# Patient Record
Sex: Female | Born: 1985 | Race: White | Hispanic: No | Marital: Married | Smoking: Former smoker
Health system: Southern US, Community
[De-identification: ages and names within clinical notes are randomized; demographics above are authoritative.]

## PROBLEM LIST (undated history)

## (undated) DIAGNOSIS — E079 Disorder of thyroid, unspecified: Secondary | ICD-10-CM

## (undated) DIAGNOSIS — K219 Gastro-esophageal reflux disease without esophagitis: Secondary | ICD-10-CM

## (undated) DIAGNOSIS — F419 Anxiety disorder, unspecified: Secondary | ICD-10-CM

## (undated) DIAGNOSIS — G43909 Migraine, unspecified, not intractable, without status migrainosus: Secondary | ICD-10-CM

## (undated) DIAGNOSIS — N809 Endometriosis, unspecified: Secondary | ICD-10-CM

## (undated) DIAGNOSIS — F32A Depression, unspecified: Secondary | ICD-10-CM

## (undated) DIAGNOSIS — I499 Cardiac arrhythmia, unspecified: Secondary | ICD-10-CM

## (undated) HISTORY — PX: ABLATION: SHX5711

## (undated) HISTORY — DX: Depression, unspecified: F32.A

## (undated) HISTORY — DX: Migraine, unspecified, not intractable, without status migrainosus: G43.909

## (undated) HISTORY — DX: Gastro-esophageal reflux disease without esophagitis: K21.9

## (undated) HISTORY — DX: Disorder of thyroid, unspecified: E07.9

## (undated) HISTORY — DX: Endometriosis, unspecified: N80.9

## (undated) HISTORY — DX: Cardiac arrhythmia, unspecified: I49.9

## (undated) HISTORY — DX: Anxiety disorder, unspecified: F41.9

## (undated) HISTORY — PX: HYSTEROSCOPY: SHX211

---
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
## 2016-08-15 DIAGNOSIS — N809 Endometriosis, unspecified: Secondary | ICD-10-CM | POA: Insufficient documentation

## 2020-07-15 DIAGNOSIS — X500XXA Overexertion from strenuous movement or load, initial encounter: Secondary | ICD-10-CM | POA: Diagnosis not present

## 2020-07-15 DIAGNOSIS — S99911A Unspecified injury of right ankle, initial encounter: Secondary | ICD-10-CM | POA: Diagnosis not present

## 2020-07-15 DIAGNOSIS — S93401A Sprain of unspecified ligament of right ankle, initial encounter: Secondary | ICD-10-CM | POA: Diagnosis not present

## 2020-07-20 ENCOUNTER — Other Ambulatory Visit: Payer: Self-pay

## 2020-07-20 ENCOUNTER — Ambulatory Visit: Payer: BC Managed Care – PPO | Admitting: Nurse Practitioner

## 2020-07-20 ENCOUNTER — Encounter: Payer: Self-pay | Admitting: Nurse Practitioner

## 2020-07-20 VITALS — BP 104/73 | HR 95 | Wt 217.0 lb

## 2020-07-20 DIAGNOSIS — F419 Anxiety disorder, unspecified: Secondary | ICD-10-CM | POA: Diagnosis not present

## 2020-07-20 DIAGNOSIS — F339 Major depressive disorder, recurrent, unspecified: Secondary | ICD-10-CM

## 2020-07-20 MED ORDER — SERTRALINE HCL 100 MG PO TABS: 2.0000 | ORAL_TABLET | Freq: Every day | ORAL | 2 refills | Status: DC

## 2020-07-20 MED ORDER — LAMOTRIGINE 25 MG PO TABS: 75.0000 mg | ORAL_TABLET | Freq: Every day | ORAL | 2 refills | Status: DC

## 2020-07-20 MED ORDER — LORATADINE 10 MG PO TABS: 10.0000 mg | ORAL_TABLET | Freq: Every day | ORAL | 2 refills | Status: DC

## 2020-07-20 MED ORDER — BUPROPION HCL ER (SR) 200 MG PO TB12
200.0000 mg | ORAL_TABLET | Freq: Two times a day (BID) | ORAL | 2 refills | Status: DC
Start: 2020-07-20 — End: 2021-12-05

## 2020-07-20 NOTE — Assessment & Plan Note (Signed)
Patient is new to clinic for establishing care.  GAD-7 completed.  Education provided to patient on generalized anxiety management.  Symptoms are well managed on current medication dose.  Rx refill sent to pharmacy.

## 2020-07-20 NOTE — Patient Instructions (Signed)
http://APA.org/depression-guideline"> https://clinicalkey.com"> http://point-of-care.elsevierperformancemanager.com/skills/"> http://point-of-care.elsevierperformancemanager.com">  Managing Depression, Adult Depression is a mental health condition that affects your thoughts, feelings, and actions. Being diagnosed with depression can bring you relief if you did not know why you have felt or behaved a certain way. It could also leave you feeling overwhelmed with uncertainty about your future. Preparing yourself tomanage your symptoms can help you feel more positive about your future. How to manage lifestyle changes Managing stress  Stress is your body's reaction to life changes and events, both good and bad. Stress can add to your feelings of depression. Learning to manage your stresscan help lessen your feelings of depression. Try some of the following approaches to reducing your stress (stress reduction techniques): Listen to music that you enjoy and that inspires you. Try using a meditation app or take a meditation class. Develop a practice that helps you connect with your spiritual self. Walk in nature, pray, or go to a place of worship. Do some deep breathing. To do this, inhale slowly through your nose. Pause at the top of your inhale for a few seconds and then exhale slowly, letting your muscles relax. Practice yoga to help relax and work your muscles. Choose a stress reduction technique that suits your lifestyle and personality. These techniques take time and practice to develop. Set aside 5-15 minutes a day to do them. Therapists can offer training in these techniques. Other things you can do to manage stress include: Keeping a stress diary. Knowing your limits and saying no when you think something is too much. Paying attention to how you react to certain situations. You may not be able to control everything, but you can change your reaction. Adding humor to your life by watching funny films  or TV shows. Making time for activities that you enjoy and that relax you.  Medicines Medicines, such as antidepressants, are often a part of treatment for depression. Talk with your pharmacist or health care provider about all the medicines, supplements, and herbal products that you take, their possible side effects, and what medicines and other products are safe to take together. Make sure to report any side effects you may have to your health care provider. Relationships Your health care provider may suggest family therapy, couples therapy, orindividual therapy as part of your treatment. How to recognize changes Everyone responds differently to treatment for depression. As you recover from depression, you may start to: Have more interest in doing activities. Feel less hopeless. Have more energy. Overeat less often, or have a better appetite. Have better mental focus. It is important to recognize if your depression is not getting better or is getting worse. The symptoms you had in the beginning may return, such as: Tiredness (fatigue) or low energy. Eating too much or too little. Sleeping too much or too little. Feeling restless, agitated, or hopeless. Trouble focusing or making decisions. Unexplained physical complaints. Feeling irritable, angry, or aggressive. If you or your family members notice these symptoms coming back, let yourhealth care provider know right away. Follow these instructions at home: Activity  Try to get some form of exercise each day, such as walking, biking, swimming, or lifting weights. Practice stress reduction techniques. Engage your mind by taking a class or doing some volunteer work.  Lifestyle Get the right amount and quality of sleep. Cut down on using caffeine, tobacco, alcohol, and other potentially harmful substances. Eat a healthy diet that includes plenty of vegetables, fruits, whole grains, low-fat dairy products, and lean protein. Do not   eat  a lot of foods that are high in solid fats, added sugars, or salt (sodium). General instructions Take over-the-counter and prescription medicines only as told by your health care provider. Keep all follow-up visits as told by your health care provider. This is important. Where to find support Talking to others  Friends and family members can be sources of support and guidance. Talk to trusted friends or family members about your condition. Explain your symptoms to them, and let them know that you are working with a health care provider to treat your depression. Tell friends and family members how they also can behelpful. Finances Find appropriate mental health providers that fit with your financial situation. Talk with your health care provider about options to get reduced prices on your medicines. Where to find more information You can find support in your area from: Anxiety and Depression Association of America (ADAA): www.adaa.org Mental Health America: www.mentalhealthamerica.net [employer] on Mental Illness: www.nami.org Contact a health care provider if: You stop taking your antidepressant medicines, and you have any of these symptoms: Nausea. Headache. Light-headedness. Chills and body aches. Not being able to sleep (insomnia). You or your friends and family think your depression is getting worse. Get help right away if: You have thoughts of hurting yourself or others. If you ever feel like you may hurt yourself or others, or have thoughts about taking your own life, get help right away. Go to your nearest emergency department or: Call your local emergency services (911 in the [place] Call a suicide crisis helpline, such as the [place] at 2131767385. This is open 24 hours a day in the [place] Text the Crisis Text Line at 414-639-1540 (in the [place].). Summary If you are diagnosed with depression, preparing yourself to manage your symptoms is a good way  to feel positive about your future. Work with your health care provider on a management plan that includes stress reduction techniques, medicines (if applicable), therapy, and healthy lifestyle habits. Keep talking with your health care provider about how your treatment is working. If you have thoughts about taking your own life, call a suicide crisis helpline or text a crisis text line. This information is not intended to replace advice given to you by your health care provider. Make sure you discuss any questions you have with your healthcare provider. Document Revised: 12/04/2018 Document Reviewed: 12/04/2018 Elsevier Patient Education  2022 [employer]. http://NIMH.NIH.Gov">  Generalized Anxiety Disorder, Adult Generalized anxiety disorder (GAD) is a mental health condition. Unlike normal worries, anxiety related to GAD is not triggered by a specific event. These worries do not fade or get better with time. GAD interferes with relationships,work, and school. GAD symptoms can vary from mild to severe. People with severe GAD can have intense waves of anxiety with physical symptoms that are similar to panicattacks. What are the causes? The exact cause of GAD is not known, but the following are believed to have an impact: Differences in natural brain chemicals. Genes passed down from parents to children. Differences in the way threats are perceived. Development during childhood. Personality. What increases the risk? The following factors may make you more likely to develop this condition: Being female. Having a family history of anxiety disorders. Being very shy. Experiencing very stressful life events, such as the death of a loved one. Having a very stressful family environment. What are the signs or symptoms? People with GAD often worry excessively about many things in their lives, such as their health  and family. Symptoms may also include: Mental and emotional symptoms: Worrying  excessively about natural disasters. Fear of being late. Difficulty concentrating. Fears that others are judging your performance. Physical symptoms: Fatigue. Headaches, muscle tension, muscle twitches, trembling, or feeling shaky. Feeling like your heart is pounding or beating very fast. Feeling out of breath or like you cannot take a deep breath. Having trouble falling asleep or staying asleep, or experiencing restlessness. Sweating. Nausea, diarrhea, or irritable bowel syndrome (IBS). Behavioral symptoms: Experiencing erratic moods or irritability. Avoidance of new situations. Avoidance of people. Extreme difficulty making decisions. How is this diagnosed? This condition is diagnosed based on your symptoms and medical history. You will also have a physical exam. Your health care provider may perform tests torule out other possible causes of your symptoms. To be diagnosed with GAD, a person must have anxiety that: Is out of his or her control. Affects several different aspects of his or her life, such as work and relationships. Causes distress that makes him or her unable to take part in normal activities. Includes at least three symptoms of GAD, such as restlessness, fatigue, trouble concentrating, irritability, muscle tension, or sleep problems. Before your health care provider can confirm a diagnosis of GAD, these symptoms must be present more days than they are not, and they must last for 6 months orlonger. How is this treated? This condition may be treated with: Medicine. Antidepressant medicine is usually prescribed for long-term daily control. Anti-anxiety medicines may be added in severe cases, especially when panic attacks occur. Talk therapy (psychotherapy). Certain types of talk therapy can be helpful in treating GAD by providing support, education, and guidance. Options include: Cognitive behavioral therapy (CBT). People learn coping skills and self-calming techniques to  ease their physical symptoms. They learn to identify unrealistic thoughts and behaviors and to replace them with more appropriate thoughts and behaviors. Acceptance and commitment therapy (ACT). This treatment teaches people how to be mindful as a way to cope with unwanted thoughts and feelings. Biofeedback. This process trains you to manage your body's response (physiological response) through breathing techniques and relaxation methods. You will work with a therapist while machines are used to monitor your physical symptoms. Stress management techniques. These include yoga, meditation, and exercise. A mental health specialist can help determine which treatment is best for you. Some people see improvement with one type of therapy. However, other peoplerequire a combination of therapies. Follow these instructions at home: Lifestyle Maintain a consistent routine and schedule. Anticipate stressful situations. Create a plan, and allow extra time to work with your plan. Practice stress management or self-calming techniques that you have learned from your therapist or your health care provider. General instructions Take over-the-counter and prescription medicines only as told by your health care provider. Understand that you are likely to have setbacks. Accept this and be kind to yourself as you persist to take better care of yourself. Recognize and accept your accomplishments, even if you judge them as small. Keep all follow-up visits as told by your health care provider. This is important. Contact a health care provider if: Your symptoms do not get better. Your symptoms get worse. You have signs of depression, such as: A persistently sad or irritable mood. Loss of enjoyment in activities that used to bring you joy. Change in weight or eating. Changes in sleeping habits. Avoiding friends or family members. Loss of energy for normal tasks. Feelings of guilt or worthlessness. Get help right away  if: You have serious thoughts  about hurting yourself or others. If you ever feel like you may hurt yourself or others, or have thoughts about taking your own life, get help right away. Go to your nearest emergency department or: Call your local emergency services (911 in the [place] Call a suicide crisis helpline, such as the [place] at (417)603-2728. This is open 24 hours a day in the [place] Text the Crisis Text Line at 417-110-2750 (in the [place].). Summary Generalized anxiety disorder (GAD) is a mental health condition that involves worry that is not triggered by a specific event. People with GAD often worry excessively about many things in their lives, such as their health and family. GAD may cause symptoms such as restlessness, trouble concentrating, sleep problems, frequent sweating, nausea, diarrhea, headaches, and trembling or muscle twitching. A mental health specialist can help determine which treatment is best for you. Some people see improvement with one type of therapy. However, other people require a combination of therapies. This information is not intended to replace advice given to you by your health care provider. Make sure you discuss any questions you have with your healthcare provider. Document Revised: 11/13/2018 Document Reviewed: 11/13/2018 Elsevier Patient Education  [place].

## 2020-07-20 NOTE — Assessment & Plan Note (Signed)
Patient is new to clinic for establishing care.  PHQ-9 completed.  Education provided to patient on generalized anxiety management.  Symptoms are well managed on current medication dose.  Rx refill sent to pharmacy.

## 2020-07-20 NOTE — Progress Notes (Signed)
New Patient Note  RE: Valerie Kim MRN: [id number] DOB: 1985-06-18 Date of Office Visit: 07/20/2020  Chief Complaint: Medical Management of Chronic Issues (New Patient to establish care), Anxiety, and Depression  History of Present Illness:  Depression, Follow-up  She  was last seen for this 2 years ago. Changes made at last visit include Lamictal 75 mg tablet daily, sertraline 200 mg tablet daily and Wellbutrin 200 mg tablet daily.   She reports good compliance with treatment. She is not having side effects.   She reports good tolerance of treatment. Current symptoms include: depressed mood, difficulty concentrating, and fatigue She feels she is Improved since last visit.  Depression screen [facility] 2/9 07/20/2020 07/20/2020  Decreased Interest 2 0  Down, Depressed, Hopeless 2 0  PHQ - 2 Score 4 0  Altered sleeping 2 -  Tired, decreased energy 3 -  Change in appetite 2 -  Feeling bad or failure about yourself  2 -  Trouble concentrating 2 -  Moving slowly or fidgety/restless 0 -  Suicidal thoughts 0 -  PHQ-9 Score 15 -    [place] Office Visit from 07/20/2020 in [facility]  PHQ-9 Total Score 15        Anxiety: Patient complains of anxiety disorder.  She has the following symptoms: difficulty concentrating, fatigue, irritable. Onset of symptoms was approximately 2 years ago, gradually improving since that time. She denies current suicidal and homicidal ideation. Family history significant for depression.Possible organic causes contributing are: none. Risk factors: positive family history in  father and previous episode of depression Previous treatment includes Wellbutrin and none.  She complains of the following side effects from the treatment: none.     Assessment and Plan: Thi is a 35 y.o. female with: No problem-specific Assessment & Plan notes found for this encounter.  Return in about 6 months (around  01/19/2021).   Diagnostics:   Past Medical History: Patient Active Problem List   Diagnosis Date Noted   Anxiety 07/20/2020   Depression, recurrent ([place]) 07/20/2020   Past Medical History:  Diagnosis Date   Depression    Endometriosis    GERD (gastroesophageal reflux disease)    Migraines    Past Surgical History: Past Surgical History:  Procedure Laterality Date   ABLATION     cardiac   HYSTEROSCOPY     Medication List:  Current Outpatient Medications  Medication Sig Dispense Refill   lamoTRIgine (LAMICTAL) 25 MG tablet Take 3 tablets (75 mg total) by mouth daily. 180 tablet 2   nitrofurantoin (MACRODANTIN) 50 MG capsule Take 50 mg by mouth daily as needed.     norethindrone (AYGESTIN) 5 MG tablet Take 5 mg by mouth daily.     buPROPion (WELLBUTRIN SR) 200 MG 12 hr tablet Take 1 tablet (200 mg total) by mouth in the morning and at bedtime. 90 tablet 2   loratadine (CLARITIN) 10 MG tablet Take 1 tablet (10 mg total) by mouth daily. 30 tablet 2   sertraline (ZOLOFT) 100 MG tablet Take 2 tablets (200 mg total) by mouth daily. 90 tablet 2   No current facility-administered medications for this visit.   Allergies: No Known Allergies Social History: Social History   Socioeconomic History   Marital status: Not on file    Spouse name: Not on file   Number of children: Not on file   Years of education: Not on file   Highest education level: Not on file  Occupational History   Not on  file  Tobacco Use   Smoking status: Former    Packs/day: 2.00    Years: 15.00    Pack years: 30.00    Types: Cigarettes, E-cigarettes    Quit date: 2017    Years since quitting: 5.4   Smokeless tobacco: Never  Vaping Use   Vaping Use: Some days  Substance and Sexual Activity   Alcohol use: Never   Drug use: Yes    Types: Marijuana   Sexual activity: Yes    Partners: Female, Female    Birth control/protection: OCP  Other Topics Concern   Not on file  Social History Narrative    Not on file   Social Determinants of Health   Financial Resource Strain: Not on file  Food Insecurity: Not on file  Transportation Needs: Not on file  Physical Activity: Not on file  Stress: Not on file  Social Connections: Not on file       Family History: Family History  Problem Relation Age of Onset   Hyperlipidemia Mother    Thyroid disease Mother    Depression Mother    Bipolar disorder Mother    Hyperlipidemia Father    Alcohol abuse Father    Depression Father    Bipolar disorder Father    Hypertension Paternal Grandmother    Heart disease Paternal Grandfather          Review of Systems  Constitutional: Negative.   HENT: Negative.    Eyes: Negative.   Respiratory: Negative.    Cardiovascular: Negative.   Skin:  Negative for rash.  Psychiatric/Behavioral:  The patient is nervous/anxious.   All other systems reviewed and are negative. Objective: BP 104/73   Pulse 95   Wt 217 lb (98.4 kg)   LMP 09/06/2017 (Approximate)   SpO2 98%  There is no height or weight on file to calculate BMI. Physical Exam Vitals reviewed.  Constitutional:      Appearance: Normal appearance.  HENT:     Head: Normocephalic.     Nose: Nose normal.     Mouth/Throat:     Mouth: Mucous membranes are moist.  Eyes:     Conjunctiva/sclera: Conjunctivae normal.  Cardiovascular:     Rate and Rhythm: Normal rate and regular rhythm.     Pulses: Normal pulses.     Heart sounds: Normal heart sounds.  Pulmonary:     Effort: Pulmonary effort is normal.     Breath sounds: Normal breath sounds.  Abdominal:     General: Bowel sounds are normal.  Skin:    Findings: No rash.  Neurological:     Mental Status: She is alert and oriented to person, place, and time.  Psychiatric:        Mood and Affect: Mood is anxious and depressed.        Speech: Speech normal.        Behavior: Behavior normal.        Thought Content: Thought content normal.   The plan was reviewed with the  patient/family, and all questions/concerned were addressed.  It was my pleasure to see Valerie Kim today and participate in her care. Please feel free to contact me with any questions or concerns.  Sincerely,  Jac Canavan NP [place]

## 2020-08-01 ENCOUNTER — Encounter (HOSPITAL_BASED_OUTPATIENT_CLINIC_OR_DEPARTMENT_OTHER): Payer: Self-pay

## 2020-08-01 ENCOUNTER — Emergency Department (HOSPITAL_BASED_OUTPATIENT_CLINIC_OR_DEPARTMENT_OTHER)
Admission: EM | Admit: 2020-08-01 | Discharge: 2020-08-02 | Disposition: A | Discharge status: Home / Self Care | Payer: BC Managed Care – PPO | Attending: Emergency Medicine | Admitting: Emergency Medicine

## 2020-08-01 ENCOUNTER — Other Ambulatory Visit: Payer: Self-pay

## 2020-08-01 ENCOUNTER — Emergency Department (HOSPITAL_BASED_OUTPATIENT_CLINIC_OR_DEPARTMENT_OTHER): Payer: BC Managed Care – PPO

## 2020-08-01 DIAGNOSIS — G4453 Primary thunderclap headache: Secondary | ICD-10-CM | POA: Diagnosis not present

## 2020-08-01 DIAGNOSIS — G43909 Migraine, unspecified, not intractable, without status migrainosus: Secondary | ICD-10-CM | POA: Diagnosis not present

## 2020-08-01 DIAGNOSIS — R519 Headache, unspecified: Secondary | ICD-10-CM | POA: Diagnosis not present

## 2020-08-01 DIAGNOSIS — Z87891 Personal history of nicotine dependence: Secondary | ICD-10-CM | POA: Insufficient documentation

## 2020-08-01 DIAGNOSIS — Z79899 Other long term (current) drug therapy: Secondary | ICD-10-CM | POA: Diagnosis not present

## 2020-08-01 MED ORDER — PROCHLORPERAZINE EDISYLATE 10 MG/2ML IJ SOLN
10.0000 mg | Freq: Once | INTRAMUSCULAR | Status: AC
  Administered 2020-08-01: 10 mg via INTRAVENOUS
  Filled 2020-08-01: qty 2

## 2020-08-01 MED ORDER — DIPHENHYDRAMINE HCL 50 MG/ML IJ SOLN
25.0000 mg | Freq: Once | INTRAMUSCULAR | Status: AC
  Administered 2020-08-01: 25 mg via INTRAVENOUS
  Filled 2020-08-01: qty 1

## 2020-08-01 MED ORDER — KETOROLAC TROMETHAMINE 15 MG/ML IJ SOLN
15.0000 mg | Freq: Once | INTRAMUSCULAR | Status: AC
  Administered 2020-08-01: 15 mg via INTRAVENOUS
  Filled 2020-08-01: qty 1

## 2020-08-01 NOTE — ED Triage Notes (Addendum)
Hx of migraines but this started approx 3 hr ago and reports the worse headache she has ever had. +nausea  took motrin 400mg  1 hr pta.

## 2020-08-01 NOTE — ED Provider Notes (Signed)
[facility] DE[facility] Provider Note   CSN: [id number] Arrival date & time: 08/01/20  2112     History Chief Complaint  Patient presents with   Migraine    Valerie Kim is a 35 y.o. female.  HPI     This is a 35 year old female with history of migraines and depression who presents with headache.  Patient reports that she had sudden onset of worst headache of her life after orgasm around 6 PM.  She has a history of migraines and has had postcoital headaches in the past but this was more severe.  She states it is mostly right-sided.  She has had nausea without vomiting.  No vision changes, weakness, numbness, strokelike symptoms.  Currently she rates her pain at 7 out of 10.  She denies any fevers or recent illnesses.  Past Medical History:  Diagnosis Date   Depression    Endometriosis    GERD (gastroesophageal reflux disease)    Migraines     Patient Active Problem List   Diagnosis Date Noted   Anxiety 07/20/2020   Depression, recurrent ([place]) 07/20/2020    Past Surgical History:  Procedure Laterality Date   ABLATION     cardiac   HYSTEROSCOPY       OB History   No obstetric history on file.     Family History  Problem Relation Age of Onset   Hyperlipidemia Mother    Thyroid disease Mother    Depression Mother    Bipolar disorder Mother    Hyperlipidemia Father    Alcohol abuse Father    Depression Father    Bipolar disorder Father    Hypertension Paternal Grandmother    Heart disease Paternal Grandfather     Social History   Tobacco Use   Smoking status: Former    Packs/day: 2.00    Years: 15.00    Pack years: 30.00    Types: Cigarettes, E-cigarettes    Quit date: 2017    Years since quitting: 5.4   Smokeless tobacco: Never  Vaping Use   Vaping Use: Some days  Substance Use Topics   Alcohol use: Never   Drug use: Yes    Types: Marijuana    Home Medications Prior to Admission medications   Medication Sig Start Date  End Date Taking? Authorizing Provider  buPROPion (WELLBUTRIN SR) 200 MG 12 hr tablet Take 1 tablet (200 mg total) by mouth in the morning and at bedtime. 07/20/20   Ivy Lynn, NP  lamoTRIgine (LAMICTAL) 25 MG tablet Take 3 tablets (75 mg total) by mouth daily. 07/20/20   Ivy Lynn, NP  loratadine (CLARITIN) 10 MG tablet Take 1 tablet (10 mg total) by mouth daily. 07/20/20   Ivy Lynn, NP  nitrofurantoin (MACRODANTIN) 50 MG capsule Take 50 mg by mouth daily as needed.    [provider]  norethindrone (AYGESTIN) 5 MG tablet Take 5 mg by mouth daily. 05/17/20   [provider]  sertraline (ZOLOFT) 100 MG tablet Take 2 tablets (200 mg total) by mouth daily. 07/20/20   Ivy Lynn, NP    Allergies    Patient has no known allergies.  Review of Systems   Review of Systems  Constitutional:  Negative for fever.  Respiratory:  Negative for shortness of breath.   Cardiovascular:  Negative for chest pain.  Gastrointestinal:  Positive for nausea. Negative for vomiting.  Genitourinary:  Negative for dysuria.  Neurological:  Positive for headaches. Negative for weakness.  All other systems reviewed and are negative.  Physical Exam Updated Vital Signs BP 114/83   Pulse 66   Temp 98.1 F (36.7 C) (Oral)   Resp 18   Ht 1.778 m (5\' 10" )   Wt 98.5 kg   SpO2 98%   BMI 31.14 kg/m   Physical Exam Vitals and nursing note reviewed.  Constitutional:      Appearance: She is well-developed.     Comments: Overweight, nontoxic-appearing  HENT:     Head: Normocephalic and atraumatic.     Nose: Nose normal.     Mouth/Throat:     Mouth: Mucous membranes are moist.  Eyes:     Extraocular Movements: Extraocular movements intact.     Pupils: Pupils are equal, round, and reactive to light.  Cardiovascular:     Rate and Rhythm: Normal rate and regular rhythm.     Heart sounds: Normal heart sounds.  Pulmonary:     Effort: Pulmonary effort is normal. No respiratory  distress.     Breath sounds: No wheezing.  Abdominal:     General: Bowel sounds are normal.     Palpations: Abdomen is soft.     Tenderness: There is no abdominal tenderness.  Musculoskeletal:     Cervical back: Normal range of motion and neck supple.     Right lower leg: No edema.     Left lower leg: No edema.  Skin:    General: Skin is warm and dry.  Neurological:     Mental Status: She is alert and oriented to person, place, and time.     Comments: Cranial nerves II through XII intact, fluent speech, 5 out of 5 strength in all 4 extremities, no dysmetria to finger-nose-finger  Psychiatric:        Mood and Affect: Mood normal.    ED Results / Procedures / Treatments   Labs (all labs ordered are listed, but only abnormal results are displayed) Labs Reviewed - No data to display  EKG None  Radiology CT Head Wo Contrast  Result Date: 08/01/2020 CLINICAL DATA:  Headache and nausea. EXAM: CT HEAD WITHOUT CONTRAST TECHNIQUE: Contiguous axial images were obtained from the base of the skull through the vertex without intravenous contrast. COMPARISON:  None. FINDINGS: Brain: No evidence of acute infarction, hemorrhage, hydrocephalus, extra-axial collection or mass lesion/mass effect. Vascular: No hyperdense vessel or unexpected calcification. Skull: Normal. Negative for fracture or focal lesion. Sinuses/Orbits: No acute finding. Other: None. IMPRESSION: No acute intracranial abnormality. Electronically Signed   By: Virgina Norfolk M.D.   On: 08/01/2020 22:15    Procedures Procedures   Medications Ordered in ED Medications  ketorolac (TORADOL) 15 MG/ML injection 15 mg (15 mg Intravenous Given 08/01/20 2307)  prochlorperazine (COMPAZINE) injection 10 mg (10 mg Intravenous Given 08/01/20 2306)  diphenhydrAMINE (BENADRYL) injection 25 mg (25 mg Intravenous Given 08/01/20 2340)    ED Course  I have reviewed the triage vital signs and the nursing notes.  Pertinent labs & imaging  results that were available during my care of the patient were reviewed by me and considered in my medical decision making (see chart for details).    MDM Rules/Calculators/A&P                          Patient presents with postorgasmic headache.  Her history and physical exam are consistent with thunderclap type headache.  She does describe worst headache of her life.  She received a CT  scan within 6 hours of onset of symptoms.  This was negative for acute bleed.  We discussed that this was fairly sensitive to rule out subarachnoid hemorrhage.  Patient was given a migraine cocktail.  On recheck, she states she feels much better.  Do not feel need to further pursue any imaging as history is fairly classic and CT scan is normal.  She is neurologically intact and no other red flags.  Will discharge home.  After history, exam, and medical workup I feel the patient has been appropriately medically screened and is safe for discharge home. Pertinent diagnoses were discussed with the patient. Patient was given return precautions.  Final Clinical Impression(s) / ED Diagnoses Final diagnoses:  Thunderclap headache    Rx / DC Orders ED Discharge Orders     None        Khadim Lundberg, Barbette Hair, MD 08/02/20 [phone]

## 2020-08-02 NOTE — Discharge Instructions (Addendum)
You were seen today for a headache.  Your headache description is consistent with a thunderclap type headache.  Your CT scan is negative which is highly sensitive to rule out bleed.  If you develop nausea, vomiting, worsening headache, strokelike symptoms, you should be reevaluated.

## 2020-08-19 ENCOUNTER — Ambulatory Visit (INDEPENDENT_AMBULATORY_CARE_PROVIDER_SITE_OTHER): Payer: BC Managed Care – PPO | Admitting: Nurse Practitioner

## 2020-08-19 ENCOUNTER — Encounter: Payer: BC Managed Care – PPO | Admitting: Nurse Practitioner

## 2020-08-19 ENCOUNTER — Other Ambulatory Visit: Payer: Self-pay

## 2020-08-19 ENCOUNTER — Encounter: Payer: Self-pay | Admitting: Nurse Practitioner

## 2020-08-19 DIAGNOSIS — F419 Anxiety disorder, unspecified: Secondary | ICD-10-CM

## 2020-08-19 DIAGNOSIS — N83202 Unspecified ovarian cyst, left side: Secondary | ICD-10-CM

## 2020-08-19 DIAGNOSIS — Z23 Encounter for immunization: Secondary | ICD-10-CM

## 2020-08-19 DIAGNOSIS — Z136 Encounter for screening for cardiovascular disorders: Secondary | ICD-10-CM | POA: Diagnosis not present

## 2020-08-19 DIAGNOSIS — Z7189 Other specified counseling: Secondary | ICD-10-CM | POA: Insufficient documentation

## 2020-08-19 DIAGNOSIS — Z0000 Encounter for general adult medical examination without abnormal findings: Secondary | ICD-10-CM

## 2020-08-19 DIAGNOSIS — Z0001 Encounter for general adult medical examination with abnormal findings: Secondary | ICD-10-CM | POA: Diagnosis not present

## 2020-08-19 DIAGNOSIS — F339 Major depressive disorder, recurrent, unspecified: Secondary | ICD-10-CM

## 2020-08-19 HISTORY — DX: Encounter for general adult medical examination without abnormal findings: Z00.00

## 2020-08-19 NOTE — Assessment & Plan Note (Signed)
Ultrasound completed results pending Pain left lower quadrant in the past few days.

## 2020-08-19 NOTE — Assessment & Plan Note (Signed)
Anxiety well controlled on current medication

## 2020-08-19 NOTE — Assessment & Plan Note (Signed)
Completed physical exam.  Provided education on preventative and health maintenance.  Follow-up in 1 year.  Patient reports left thyroid nodule that is being assessed every 6 months.  Completed Paps recently with OB/GYN. Medications up to date.

## 2020-08-19 NOTE — Assessment & Plan Note (Signed)
Symptoms well controlled on current medication .  Education provided, printed hand out given

## 2020-08-19 NOTE — Patient Instructions (Signed)
Health Maintenance, Female Adopting a healthy lifestyle and getting preventive care are important in promoting health and wellness. Ask your health care provider about: The right schedule for you to have regular tests and exams. Things you can do on your own to prevent diseases and keep yourself healthy. What should I know about diet, weight, and exercise? Eat a healthy diet  Eat a diet that includes plenty of vegetables, fruits, low-fat dairy products, and lean protein. Do not eat a lot of foods that are high in solid fats, added sugars, or sodium.  Maintain a healthy weight Body mass index (BMI) is used to identify weight problems. It estimates body fat based on height and weight. Your health care provider can help determineyour BMI and help you achieve or maintain a healthy weight. Get regular exercise Get regular exercise. This is one of the most important things you can do for your health. Most adults should: Exercise for at least 150 minutes each week. The exercise should increase your heart rate and make you sweat (moderate-intensity exercise). Do strengthening exercises at least twice a week. This is in addition to the moderate-intensity exercise. Spend less time sitting. Even light physical activity can be beneficial. Watch cholesterol and blood lipids Have your blood tested for lipids and cholesterol at 36 years of age, then havethis test every 5 years. Have your cholesterol levels checked more often if: Your lipid or cholesterol levels are high. You are older than 36 years of age. You are at high risk for heart disease. What should I know about cancer screening? Depending on your health history and family history, you may need to have cancer screening at various ages. This may include screening for: Breast cancer. Cervical cancer. Colorectal cancer. Skin cancer. Lung cancer. What should I know about heart disease, diabetes, and high blood pressure? Blood pressure and heart  disease High blood pressure causes heart disease and increases the risk of stroke. This is more likely to develop in people who have high blood pressure readings, are of African descent, or are overweight. Have your blood pressure checked: Every 3-5 years if you are 18-39 years of age. Every year if you are 40 years old or older. Diabetes Have regular diabetes screenings. This checks your fasting blood sugar level. Have the screening done: Once every three years after age 40 if you are at a normal weight and have a low risk for diabetes. More often and at a younger age if you are overweight or have a high risk for diabetes. What should I know about preventing infection? Hepatitis B If you have a higher risk for hepatitis B, you should be screened for this virus. Talk with your health care provider to find out if you are at risk forhepatitis B infection. Hepatitis C Testing is recommended for: Everyone born from 1945 through 1965. Anyone with known risk factors for hepatitis C. Sexually transmitted infections (STIs) Get screened for STIs, including gonorrhea and chlamydia, if: You are sexually active and are younger than 36 years of age. You are older than 36 years of age and your health care provider tells you that you are at risk for this type of infection. Your sexual activity has changed since you were last screened, and you are at increased risk for chlamydia or gonorrhea. Ask your health care provider if you are at risk. Ask your health care provider about whether you are at high risk for HIV. Your health care provider may recommend a prescription medicine to help   prevent HIV infection. If you choose to take medicine to prevent HIV, you should first get tested for HIV. You should then be tested every 3 months for as long as you are taking the medicine. Pregnancy If you are about to stop having your period (premenopausal) and you may become pregnant, seek counseling before you get  pregnant. Take 400 to 800 micrograms (mcg) of folic acid every day if you become pregnant. Ask for birth control (contraception) if you want to prevent pregnancy. Osteoporosis and menopause Osteoporosis is a disease in which the bones lose minerals and strength with aging. This can result in bone fractures. If you are 65 years old or older, or if you are at risk for osteoporosis and fractures, ask your health care provider if you should: Be screened for bone loss. Take a calcium or vitamin D supplement to lower your risk of fractures. Be given hormone replacement therapy (HRT) to treat symptoms of menopause. Follow these instructions at home: Lifestyle Do not use any products that contain nicotine or tobacco, such as cigarettes, e-cigarettes, and chewing tobacco. If you need help quitting, ask your health care provider. Do not use street drugs. Do not share needles. Ask your health care provider for help if you need support or information about quitting drugs. Alcohol use Do not drink alcohol if: Your health care provider tells you not to drink. You are pregnant, may be pregnant, or are planning to become pregnant. If you drink alcohol: Limit how much you use to 0-1 drink a day. Limit intake if you are breastfeeding. Be aware of how much alcohol is in your drink. In the [place] one drink equals one 12 oz bottle of beer (355 mL), one 5 oz glass of wine (148 mL), or one 1 oz glass of hard liquor (44 mL). General instructions Schedule regular health, dental, and eye exams. Stay current with your vaccines. Tell your health care provider if: You often feel depressed. You have ever been abused or do not feel safe at home. Summary Adopting a healthy lifestyle and getting preventive care are important in promoting health and wellness. Follow your health care provider's instructions about healthy diet, exercising, and getting tested or screened for diseases. Follow your health care provider's  instructions on monitoring your cholesterol and blood pressure. This information is not intended to replace advice given to you by your health care provider. Make sure you discuss any questions you have with your healthcare provider. Document Revised: 01/16/2018 Document Reviewed: 01/16/2018 Elsevier Patient Education  2022 Elsevier Inc.  

## 2020-08-19 NOTE — Progress Notes (Signed)
Established Patient Office Visit  Subjective:  Patient ID: Valerie Kim, female    DOB: 08-24-1985  Age: 35 y.o. MRN: [id number]  CC:  Chief Complaint  Patient presents with   Annual Exam    HPI Valerie Kim presents for .   Encounter for general adult medical examination Physical: Patient's last physical exam was 1 year ago .  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 Dental Care: biannual cleanings, brushes and flosses daily. Ophthalmology/Optometry: Annual visit.  Hearing loss: none Vision impairments: Wears prescription glasses  Past Medical History:  Diagnosis Date   Depression    Endometriosis    GERD (gastroesophageal reflux disease)    Migraines     Past Surgical History:  Procedure Laterality Date   ABLATION     cardiac   HYSTEROSCOPY      Family History  Problem Relation Age of Onset   Hyperlipidemia Mother    Thyroid disease Mother    Depression Mother    Bipolar disorder Mother    Hyperlipidemia Father    Alcohol abuse Father    Depression Father    Bipolar disorder Father    Hypertension Paternal Grandmother    Heart disease Paternal Grandfather     Social History   Socioeconomic History   Marital status: Married    Spouse name: Not on  file   Number of children: Not on file   Years of education: Not on file   Highest education level: Not on file  Occupational History   Not on file  Tobacco Use   Smoking status: Former    Packs/day: 2.00    Years: 15.00    Pack years: 30.00    Types: Cigarettes, E-cigarettes    Quit date: 2017    Years since quitting: 5.5   Smokeless tobacco: Never  Vaping Use   Vaping Use: Every day  Substance and Sexual Activity   Alcohol use: Never   Drug use: Yes    Types: Marijuana   Sexual activity: Yes    Partners: Female, Female    Birth control/protection: OCP  Other Topics Concern   Not on file  Social History Narrative   Not on file   Social Determinants of Health   Financial Resource Strain: Not on file  Food Insecurity: Not on file  Transportation Needs: Not on file  Physical Activity: Not on file  Stress: Not on file  Social Connections: Not on file  Intimate Partner Violence: Not on file    Outpatient Medications Prior to Visit  Medication Sig Dispense Refill   buPROPion (WELLBUTRIN SR) 200 MG 12 hr tablet Take 1 tablet (200 mg total) by mouth in the morning and at bedtime. 90 tablet 2   lamoTRIgine (LAMICTAL) 25 MG tablet Take 3 tablets (75 mg total) by mouth daily. 180 tablet 2   loratadine (CLARITIN)  10 MG tablet Take 1 tablet (10 mg total) by mouth daily. 30 tablet 2   nitrofurantoin (MACRODANTIN) 50 MG capsule Take 50 mg by mouth daily as needed.     norethindrone (AYGESTIN) 5 MG tablet Take 5 mg by mouth daily.     sertraline (ZOLOFT) 100 MG tablet Take 2 tablets (200 mg total) by mouth daily. 90 tablet 2   No facility-administered medications prior to visit.    No Known Allergies  ROS Review of Systems  Constitutional: Negative.   HENT: Negative.    Eyes: Negative.   Respiratory: Negative.    Cardiovascular: Negative.   Genitourinary: Negative.   Musculoskeletal: Negative.   Skin: Negative.   Neurological: Negative.   Psychiatric/Behavioral:  The  patient is nervous/anxious.   All other systems reviewed and are negative.    Objective:    Physical Exam Vitals and nursing note reviewed.  Constitutional:      Appearance: Normal appearance.  HENT:     Head: Normocephalic.     Right Ear: Ear canal and external ear normal.     Nose: Nose normal.     Mouth/Throat:     Mouth: Mucous membranes are moist.     Pharynx: Oropharynx is clear.  Eyes:     Conjunctiva/sclera: Conjunctivae normal.  Cardiovascular:     Rate and Rhythm: Normal rate and regular rhythm.     Pulses: Normal pulses.     Heart sounds: Normal heart sounds.  Pulmonary:     Effort: Pulmonary effort is normal.     Breath sounds: Normal breath sounds.  Abdominal:     General: Bowel sounds are normal.     Palpations: Abdomen is soft.     Tenderness: There is abdominal tenderness in the suprapubic area.       Comments: LLQ pain   Musculoskeletal:     Cervical back: Normal range of motion.  Skin:    Findings: No rash.  Neurological:     Mental Status: She is alert and oriented to person, place, and time.  Psychiatric:        Attention and Perception: Attention and perception normal.        Mood and Affect: Mood is anxious and depressed.        Speech: Speech normal.        Behavior: Behavior is uncooperative.    There were no vitals taken for this visit. Wt Readings from Last 3 Encounters:  08/01/20 217 lb 0.7 oz (98.5 kg)  07/20/20 217 lb (98.4 kg)     Health Maintenance Due  Topic Date Due   HIV Screening  Never done   Hepatitis C Screening  Never done   PAP SMEAR-Modifier  Never done        [place] Visit from 07/20/2020 in [facility]  PHQ-9 Total Score 15       GAD 7 : Generalized Anxiety Score 07/20/2020  Nervous, Anxious, on Edge 2  Control/stop worrying 3  Worry too much - different things 3  Trouble relaxing 3  Restless 2  Easily annoyed or irritable 2  Anxiety Difficulty Somewhat difficult      Assessment & Plan:   Problem List Items Addressed This Visit       Endocrine   Cyst of left ovary    Ultrasound completed results pending Pain left lower quadrant in the past few days.         Relevant Orders   US Pelvic Complete  With Transvaginal     Other   Anxiety    Anxiety well controlled on current medication       Depression, recurrent ([place])    Symptoms well controlled on current medication .  Education provided, printed hand out given       Annual physical exam - Primary    Completed physical exam.  Provided education on preventative and health maintenance.  Follow-up in 1 year.  Patient reports left thyroid nodule that is being assessed every 6 months.  Completed Paps recently with OB/GYN. Medications up to date.       Relevant Orders   CBC with Differential   Comprehensive metabolic panel   Lipid Panel    No orders of the defined types were placed in this encounter.   Follow-up: Return in about 1 year (around 08/19/2021) for physical.    Ivy Lynn, NP

## 2020-08-20 LAB — CBC WITH DIFFERENTIAL/PLATELET
Basophils Absolute: 0 10*3/uL (ref 0.0–0.2)
Basos: 1 %
EOS (ABSOLUTE): 0.2 10*3/uL (ref 0.0–0.4)
Eos: 3 %
Hematocrit: 45.4 % (ref 34.0–46.6)
Hemoglobin: 15 g/dL (ref 11.1–15.9)
Immature Grans (Abs): 0 10*3/uL (ref 0.0–0.1)
Immature Granulocytes: 0 %
Lymphocytes Absolute: 1.7 10*3/uL (ref 0.7–3.1)
Lymphs: 28 %
MCH: 29.9 pg (ref 26.6–33.0)
MCHC: 33 g/dL (ref 31.5–35.7)
MCV: 90 fL (ref 79–97)
Monocytes Absolute: 0.6 10*3/uL (ref 0.1–0.9)
Monocytes: 10 %
Neutrophils Absolute: 3.5 10*3/uL (ref 1.4–7.0)
Neutrophils: 58 %
Platelets: 336 10*3/uL (ref 150–450)
RBC: 5.02 x10E6/uL (ref 3.77–5.28)
RDW: 12.3 % (ref 11.7–15.4)
WBC: 6.1 10*3/uL (ref 3.4–10.8)

## 2020-08-20 LAB — LIPID PANEL
Chol/HDL Ratio: 4.6 ratio — ABNORMAL HIGH (ref 0.0–4.4)
Cholesterol, Total: 207 mg/dL — ABNORMAL HIGH (ref 100–199)
HDL: 45 mg/dL (ref >39)
LDL Chol Calc (NIH): 134 mg/dL — ABNORMAL HIGH (ref 0–99)
Triglycerides: 155 mg/dL — ABNORMAL HIGH (ref 0–149)
VLDL Cholesterol Cal: 28 mg/dL (ref 5–40)

## 2020-08-20 LAB — COMPREHENSIVE METABOLIC PANEL
ALT: 9 IU/L (ref 0–32)
AST: 13 IU/L (ref 0–40)
Albumin/Globulin Ratio: 2.4 — ABNORMAL HIGH (ref 1.2–2.2)
Albumin: 5 g/dL — ABNORMAL HIGH (ref 3.8–4.8)
Alkaline Phosphatase: 95 IU/L (ref 44–121)
BUN/Creatinine Ratio: 10 (ref 9–23)
BUN: 9 mg/dL (ref 6–20)
Bilirubin Total: <0.2 mg/dL (ref 0.0–1.2)
CO2: 25 mmol/L (ref 20–29)
Calcium: 9.5 mg/dL (ref 8.7–10.2)
Chloride: 98 mmol/L (ref 96–106)
Creatinine, Ser: 0.94 mg/dL (ref 0.57–1.00)
Globulin, Total: 2.1 g/dL (ref 1.5–4.5)
Glucose: 72 mg/dL (ref 65–99)
Potassium: 4.1 mmol/L (ref 3.5–5.2)
Sodium: 139 mmol/L (ref 134–144)
Total Protein: 7.1 g/dL (ref 6.0–8.5)
eGFR: 81 mL/min/{1.73_m2} (ref >59)

## 2020-08-23 ENCOUNTER — Telehealth: Payer: Self-pay | Admitting: Nurse Practitioner

## 2020-08-27 ENCOUNTER — Other Ambulatory Visit: Payer: Self-pay

## 2020-08-27 ENCOUNTER — Ambulatory Visit (HOSPITAL_COMMUNITY)
Admission: RE | Admit: 2020-08-27 | Discharge: 2020-08-27 | Disposition: A | Discharge status: Home / Self Care | Payer: BC Managed Care – PPO | Source: Ambulatory Visit | Attending: Nurse Practitioner | Admitting: Nurse Practitioner

## 2020-08-27 DIAGNOSIS — N888 Other specified noninflammatory disorders of cervix uteri: Secondary | ICD-10-CM | POA: Diagnosis not present

## 2020-08-27 DIAGNOSIS — N939 Abnormal uterine and vaginal bleeding, unspecified: Secondary | ICD-10-CM | POA: Diagnosis not present

## 2020-08-27 DIAGNOSIS — N83202 Unspecified ovarian cyst, left side: Secondary | ICD-10-CM | POA: Diagnosis not present

## 2020-08-29 ENCOUNTER — Other Ambulatory Visit: Payer: Self-pay | Admitting: Nurse Practitioner

## 2020-08-29 DIAGNOSIS — N83202 Unspecified ovarian cyst, left side: Secondary | ICD-10-CM

## 2020-12-02 DIAGNOSIS — F411 Generalized anxiety disorder: Secondary | ICD-10-CM | POA: Diagnosis not present

## 2020-12-02 DIAGNOSIS — F331 Major depressive disorder, recurrent, moderate: Secondary | ICD-10-CM | POA: Diagnosis not present

## 2020-12-02 DIAGNOSIS — F4312 Post-traumatic stress disorder, chronic: Secondary | ICD-10-CM | POA: Diagnosis not present

## 2020-12-02 DIAGNOSIS — F428 Other obsessive-compulsive disorder: Secondary | ICD-10-CM | POA: Diagnosis not present

## 2021-01-05 DIAGNOSIS — F428 Other obsessive-compulsive disorder: Secondary | ICD-10-CM | POA: Diagnosis not present

## 2021-01-05 DIAGNOSIS — F411 Generalized anxiety disorder: Secondary | ICD-10-CM | POA: Diagnosis not present

## 2021-01-05 DIAGNOSIS — F4312 Post-traumatic stress disorder, chronic: Secondary | ICD-10-CM | POA: Diagnosis not present

## 2021-01-05 DIAGNOSIS — F331 Major depressive disorder, recurrent, moderate: Secondary | ICD-10-CM | POA: Diagnosis not present

## 2021-01-19 ENCOUNTER — Ambulatory Visit: Payer: BC Managed Care – PPO | Admitting: Nurse Practitioner

## 2021-02-02 DIAGNOSIS — F411 Generalized anxiety disorder: Secondary | ICD-10-CM | POA: Diagnosis not present

## 2021-02-02 DIAGNOSIS — F4312 Post-traumatic stress disorder, chronic: Secondary | ICD-10-CM | POA: Diagnosis not present

## 2021-02-02 DIAGNOSIS — F331 Major depressive disorder, recurrent, moderate: Secondary | ICD-10-CM | POA: Diagnosis not present

## 2021-02-02 DIAGNOSIS — F605 Obsessive-compulsive personality disorder: Secondary | ICD-10-CM | POA: Diagnosis not present

## 2021-03-31 DIAGNOSIS — F331 Major depressive disorder, recurrent, moderate: Secondary | ICD-10-CM | POA: Diagnosis not present

## 2021-03-31 DIAGNOSIS — F4312 Post-traumatic stress disorder, chronic: Secondary | ICD-10-CM | POA: Diagnosis not present

## 2021-03-31 DIAGNOSIS — F411 Generalized anxiety disorder: Secondary | ICD-10-CM | POA: Diagnosis not present

## 2021-03-31 DIAGNOSIS — F605 Obsessive-compulsive personality disorder: Secondary | ICD-10-CM | POA: Diagnosis not present

## 2021-09-05 ENCOUNTER — Ambulatory Visit (INDEPENDENT_AMBULATORY_CARE_PROVIDER_SITE_OTHER): Payer: BC Managed Care – PPO | Admitting: Physician Assistant

## 2021-09-05 ENCOUNTER — Encounter: Payer: Self-pay | Admitting: Physician Assistant

## 2021-09-05 VITALS — BP 122/82 | HR 76 | Temp 98.0°F | Ht 70.0 in | Wt 209.6 lb

## 2021-09-05 DIAGNOSIS — E041 Nontoxic single thyroid nodule: Secondary | ICD-10-CM | POA: Diagnosis not present

## 2021-09-05 DIAGNOSIS — N809 Endometriosis, unspecified: Secondary | ICD-10-CM

## 2021-09-05 DIAGNOSIS — Z0000 Encounter for general adult medical examination without abnormal findings: Secondary | ICD-10-CM | POA: Diagnosis not present

## 2021-09-05 DIAGNOSIS — E782 Mixed hyperlipidemia: Secondary | ICD-10-CM

## 2021-09-05 DIAGNOSIS — D485 Neoplasm of uncertain behavior of skin: Secondary | ICD-10-CM

## 2021-09-05 LAB — CBC WITH DIFFERENTIAL/PLATELET
Basophils Absolute: 0 10*3/uL (ref 0.0–0.1)
Basophils Relative: 0.4 % (ref 0.0–3.0)
Eosinophils Absolute: 0.1 10*3/uL (ref 0.0–0.7)
Eosinophils Relative: 3 % (ref 0.0–5.0)
HCT: 40.8 % (ref 36.0–46.0)
Hemoglobin: 14 g/dL (ref 12.0–15.0)
Lymphocytes Relative: 31.9 % (ref 12.0–46.0)
Lymphs Abs: 1.5 10*3/uL (ref 0.7–4.0)
MCHC: 34.3 g/dL (ref 30.0–36.0)
MCV: 88 fl (ref 78.0–100.0)
Monocytes Absolute: 0.5 10*3/uL (ref 0.1–1.0)
Monocytes Relative: 11.4 % (ref 3.0–12.0)
Neutro Abs: 2.5 10*3/uL (ref 1.4–7.7)
Neutrophils Relative %: 53.3 % (ref 43.0–77.0)
Platelets: 281 10*3/uL (ref 150.0–400.0)
RBC: 4.64 Mil/uL (ref 3.87–5.11)
RDW: 12.8 % (ref 11.5–15.5)
WBC: 4.7 10*3/uL (ref 4.0–10.5)

## 2021-09-05 LAB — COMPREHENSIVE METABOLIC PANEL
ALT: 26 U/L (ref 0–35)
AST: 24 U/L (ref 0–37)
Albumin: 4.4 g/dL (ref 3.5–5.2)
Alkaline Phosphatase: 67 U/L (ref 39–117)
BUN: 7 mg/dL (ref 6–23)
CO2: 26 mEq/L (ref 19–32)
Calcium: 9.7 mg/dL (ref 8.4–10.5)
Chloride: 107 mEq/L (ref 96–112)
Creatinine, Ser: 0.76 mg/dL (ref 0.40–1.20)
GFR: 100.26 mL/min (ref >60.00)
Glucose, Bld: 74 mg/dL (ref 70–99)
Potassium: 4 mEq/L (ref 3.5–5.1)
Sodium: 143 mEq/L (ref 135–145)
Total Bilirubin: 0.6 mg/dL (ref 0.2–1.2)
Total Protein: 6.9 g/dL (ref 6.0–8.3)

## 2021-09-05 LAB — LIPID PANEL
Cholesterol: 168 mg/dL (ref 0–200)
HDL: 43.2 mg/dL (ref >39.00)
LDL Cholesterol: 107 mg/dL — ABNORMAL HIGH (ref 0–99)
NonHDL: 124.9
Total CHOL/HDL Ratio: 4
Triglycerides: 90 mg/dL (ref 0.0–149.0)
VLDL: 18 mg/dL (ref 0.0–40.0)

## 2021-09-05 NOTE — Progress Notes (Signed)
Subjective:    Patient ID: Valerie Kim, female    DOB: 05-25-1985, 36 y.o.   MRN: [id number]  Chief Complaint  Patient presents with   Annual Exam    Pt is in for annual CPE; pt is not fasting; pt wants full lab work to specifically check thyroid and levels;     HPI 36 y.o. patient presents today for new patient establishment with me.  Patient was previously established with provider in [place]. Working from home; married, 4 kids. Enjoys gardening. Moved last year from [place], [place]. Husband previously Nature conservation officer.   Current Care Team: Needing dermatology and gynecology referrals    Acute concerns: Magic mushrooms psylocibin 2 mg twice weekly - husband grows them  - says this really helps her anxiety and depression  Health maintenance: Lifestyle/ exercise: Hiking, kayaking; gardening, swimming  Nutrition: Eats mostly at home; hybrid diet (husband is vegan); drinks plenty of water  Mental health: Moody, irritable occasionally, working on natural remedies; off anti-depressants for the last month  Caffeine: One diet soda daily; coffee in the mornings  Sleep: Falling asleep is an issue, but does well once asleep  Substance use: Marijuana daily, CBD gummies; intermittent vaping; had previously smoked 1 ppd cigarettes x 17 years, none in 5 years now ETOH: Rare Sexual activity: Chlamydia (2012? Month of doxycycline) & HPV (once on a pap at age 71); monogamous with husband  Immunizations: UTD  Colonoscopy: Low risk, start at age 72 Pap: GYN will update, referral today  Mammogram: No family hx, will start at age 16     Past Medical History:  Diagnosis Date   Anxiety    Depression    Endometriosis    GERD (gastroesophageal reflux disease)    Migraines    Thyroid disease 2020   36m nodule noted and monitored through [place]   Past Surgical History:  Procedure Laterality Date   ABLATION     cardiac   HYSTEROSCOPY      Family History  Problem Relation Age of Onset    Hyperlipidemia Mother    Thyroid disease Mother    Depression Mother    Bipolar disorder Mother    Arthritis Mother    Miscarriages / [place]    Varicose Veins Mother    Hyperlipidemia Father    Alcohol abuse Father    Depression Father    Bipolar disorder Father    Arthritis Father    Hypertension Father    Hypertension Paternal Grandmother    Heart disease Paternal Grandmother    Heart disease Paternal Grandfather    Hypertension Paternal GMerchant navy officer   ADD / ADHD Son    ADD / ADHD Brother    Hypertension Brother    Learning disabilities Brother    Anxiety disorder Daughter    Anxiety disorder Daughter     Social History   Tobacco Use   Smoking status: Former    Packs/day: 1.50    Years: 15.00    Total pack years: 22.50    Types: Cigarettes, E-cigarettes    Quit date: 02/07/2015    Years since quitting: 6.5   Smokeless tobacco: Never  Vaping Use   Vaping Use: Every day  Substance Use Topics   Alcohol use: Yes    Comment: Occassional social 1-2 drinks   Drug use: Yes    Frequency: 7.0 times per week    Types: Marijuana, Psilocybin     No Known Allergies  Review of Systems NEGATIVE UNLESS OTHERWISE INDICATED IN  HPI      Objective:     BP 122/82 (BP Location: Right Arm)   Pulse 76   Temp 98 F (36.7 C) (Temporal)   Ht '5\' 10"'$  (1.778 m)   Wt 209 lb 9.6 oz (95.1 kg)   LMP 08/22/2021 (Exact Date)   SpO2 100%   BMI 30.07 kg/m   Wt Readings from Last 3 Encounters:  09/05/21 209 lb 9.6 oz (95.1 kg)  08/01/20 217 lb 0.7 oz (98.5 kg)  07/20/20 217 lb (98.4 kg)    BP Readings from Last 3 Encounters:  09/05/21 122/82  08/02/20 (!) 108/91  07/20/20 104/73     Physical Exam Vitals and nursing note reviewed.  Constitutional:      Appearance: Normal appearance. She is normal weight. She is not toxic-appearing.  HENT:     Head: Normocephalic and atraumatic.     Right Ear: Tympanic membrane, ear canal and external ear normal.     Left Ear:  Tympanic membrane, ear canal and external ear normal.     Nose: Nose normal.     Mouth/Throat:     Mouth: Mucous membranes are moist.  Eyes:     Extraocular Movements: Extraocular movements intact.     Conjunctiva/sclera: Conjunctivae normal.     Pupils: Pupils are equal, round, and reactive to light.  Neck:     Thyroid: Thyromegaly (noticed fullness R thyroid) present. No thyroid tenderness.  Cardiovascular:     Rate and Rhythm: Normal rate and regular rhythm.     Pulses: Normal pulses.     Heart sounds: Normal heart sounds.  Pulmonary:     Effort: Pulmonary effort is normal.     Breath sounds: Normal breath sounds.  Abdominal:     General: Abdomen is flat. Bowel sounds are normal.     Palpations: Abdomen is soft.  Musculoskeletal:        General: Normal range of motion.     Cervical back: Normal range of motion and neck supple.  Lymphadenopathy:     Cervical: No cervical adenopathy.     Right cervical: No superficial cervical adenopathy.    Left cervical: No superficial cervical adenopathy.  Skin:    General: Skin is warm and dry.     Findings: Lesion (numerous melanocytic nevi; there is a 1 cm oval pink raised nevus mid back; right lower lateral abdomen there is a multi-colored raised nevus; new pink raised lesion nose) present.  Neurological:     General: No focal deficit present.     Mental Status: She is alert and oriented to person, place, and time.  Psychiatric:        Mood and Affect: Mood normal.        Behavior: Behavior normal.        Thought Content: Thought content normal.        Judgment: Judgment normal.        Assessment & Plan:   Problem List Items Addressed This Visit   None Visit Diagnoses     Encounter for physical examination    -  Primary   Relevant Orders   HIV antibody (with reflex)   Hepatitis C Antibody   CBC with Differential/Platelet   Comprehensive metabolic panel   Lipid panel   Thyroid Panel With TSH   Mixed hyperlipidemia        Relevant Orders   Lipid panel   Thyroid nodule       Relevant Orders   Thyroid Panel With TSH  US THYROID   Endometriosis       Relevant Orders   Ambulatory referral to Gynecology   Neoplasm of uncertain behavior of skin       Relevant Orders   Ambulatory referral to Dermatology      Plan: -New pt establishment today -Age-appropriate screening and counseling performed today. Will check labs and call with results. Preventive measures discussed and printed in AVS for patient.   Thyroid US - someone will call you to schedule Referrals to GYN and derm  Patient Counseling: '[x]'$   Nutrition: Stressed importance of moderation in sodium/caffeine intake, saturated fat and cholesterol, caloric balance, sufficient intake of fresh fruits, vegetables, and fiber.  '[x]'$   Stressed the importance of regular exercise.   '[x]'$   Substance Abuse: Discussed cessation/primary prevention of tobacco, alcohol, or other drug use; driving or other dangerous activities under the influence; availability of treatment for abuse.   '[]'$   Injury prevention: Discussed safety belts, safety helmets, smoke detector, smoking near bedding or upholstery.   '[]'$   Sexuality: Discussed sexually transmitted diseases, partner selection, use of condoms, avoidance of unintended pregnancy  and contraceptive alternatives.   '[x]'$   Dental health: Discussed importance of regular tooth brushing, flossing, and dental visits.  '[x]'$   Health maintenance and immunizations reviewed. Please refer to Health maintenance section.    Call sooner if any concern! Happy to meet you!   Return in about 1 year (around 09/06/2022) for CPE.    Rejina Odle M Buren Havey, PA-C

## 2021-09-05 NOTE — Patient Instructions (Addendum)
Welcome to [facility]! It was a pleasure meeting you today.  As discussed, Please schedule a 12 month follow up visit today for next physical.  Labs today Thyroid US - someone will call you to schedule Referrals to GYN and derm  Happy to take any concerning lesions off here in office - just schedule anytime!    PLEASE NOTE:  If you had any LAB tests please let us know if you have not heard back within a few days. You may see your results on MyChart before we have a chance to review them but we will give you a call once they are reviewed by [place]. If we ordered any REFERRALS today, please let us know if you have not heard from their office within the next two weeks. Let us know through MyChart if you are needing REFILLS, or have your pharmacy send [place] the request. You can also use MyChart to communicate with me or any office staff.  Please try these tips to maintain a healthy lifestyle:  Eat most of your calories during the day when you are active. Eliminate processed foods including packaged sweets (pies, cakes, cookies), reduce intake of potatoes, white bread, white pasta, and white rice. Look for whole grain options, oat flour or almond flour.  Each meal should contain half fruits/vegetables, one quarter protein, and one quarter carbs (no bigger than a computer mouse).  Cut down on sweet beverages. This includes juice, soda, and sweet tea. Also watch fruit intake, though this is a healthier sweet option, it still contains natural sugar! Limit to 3 servings daily.  Drink at least 1 glass of water with each meal and aim for at least 8 glasses (64 ounces) per day.  Exercise at least 150 minutes every week to the best of your ability.    Take Care,  Kaydince Towles, PA-C

## 2021-09-06 LAB — THYROID PANEL WITH TSH
Free Thyroxine Index: 2.2 (ref 1.4–3.8)
T3 Uptake: 28 % (ref 22–35)
T4, Total: 7.9 ug/dL (ref 5.1–11.9)
TSH: 1.48 mIU/L

## 2021-09-06 LAB — HEPATITIS C ANTIBODY: Hepatitis C Ab: NONREACTIVE

## 2021-09-06 LAB — HIV ANTIBODY (ROUTINE TESTING W REFLEX): HIV 1&2 Ab, 4th Generation: NONREACTIVE

## 2021-09-07 ENCOUNTER — Ambulatory Visit
Admission: RE | Admit: 2021-09-07 | Discharge: 2021-09-07 | Disposition: A | Discharge status: Home / Self Care | Payer: BC Managed Care – PPO | Source: Ambulatory Visit | Attending: Physician Assistant | Admitting: Physician Assistant

## 2021-09-07 DIAGNOSIS — E041 Nontoxic single thyroid nodule: Secondary | ICD-10-CM

## 2021-09-09 ENCOUNTER — Other Ambulatory Visit: Payer: Self-pay

## 2021-09-09 DIAGNOSIS — E041 Nontoxic single thyroid nodule: Secondary | ICD-10-CM

## 2021-09-20 DIAGNOSIS — D225 Melanocytic nevi of trunk: Secondary | ICD-10-CM | POA: Diagnosis not present

## 2021-09-20 DIAGNOSIS — D2230 Melanocytic nevi of unspecified part of face: Secondary | ICD-10-CM | POA: Diagnosis not present

## 2021-09-20 DIAGNOSIS — L578 Other skin changes due to chronic exposure to nonionizing radiation: Secondary | ICD-10-CM | POA: Diagnosis not present

## 2021-09-20 DIAGNOSIS — D171 Benign lipomatous neoplasm of skin and subcutaneous tissue of trunk: Secondary | ICD-10-CM | POA: Diagnosis not present

## 2021-09-28 ENCOUNTER — Encounter: Payer: Self-pay | Admitting: Physician Assistant

## 2021-10-06 ENCOUNTER — Other Ambulatory Visit: Payer: BC Managed Care – PPO

## 2021-10-06 DIAGNOSIS — E041 Nontoxic single thyroid nodule: Secondary | ICD-10-CM | POA: Diagnosis not present

## 2021-10-07 LAB — THYROID PEROXIDASE ANTIBODIES (TPO) (REFL): Thyroperoxidase Ab SerPl-aCnc: <1 IU/mL (ref <9)

## 2021-10-31 ENCOUNTER — Encounter: Payer: Self-pay | Admitting: *Deleted

## 2021-12-05 ENCOUNTER — Ambulatory Visit: Payer: BC Managed Care – PPO | Admitting: Physician Assistant

## 2021-12-05 ENCOUNTER — Encounter: Payer: Self-pay | Admitting: Physician Assistant

## 2021-12-05 ENCOUNTER — Ambulatory Visit (INDEPENDENT_AMBULATORY_CARE_PROVIDER_SITE_OTHER)
Admission: RE | Admit: 2021-12-05 | Discharge: 2021-12-05 | Disposition: A | Discharge status: Home / Self Care | Payer: BC Managed Care – PPO | Source: Ambulatory Visit | Attending: Physician Assistant | Admitting: Physician Assistant

## 2021-12-05 VITALS — BP 100/72 | HR 81 | Temp 97.7°F | Ht 70.0 in | Wt 193.0 lb

## 2021-12-05 DIAGNOSIS — M545 Low back pain, unspecified: Secondary | ICD-10-CM

## 2021-12-05 DIAGNOSIS — I471 Supraventricular tachycardia, unspecified: Secondary | ICD-10-CM | POA: Insufficient documentation

## 2021-12-05 DIAGNOSIS — F419 Anxiety disorder, unspecified: Secondary | ICD-10-CM

## 2021-12-05 DIAGNOSIS — F339 Major depressive disorder, recurrent, unspecified: Secondary | ICD-10-CM | POA: Diagnosis not present

## 2021-12-05 DIAGNOSIS — S60416A Abrasion of right little finger, initial encounter: Secondary | ICD-10-CM

## 2021-12-05 DIAGNOSIS — G8929 Other chronic pain: Secondary | ICD-10-CM

## 2021-12-05 DIAGNOSIS — M47816 Spondylosis without myelopathy or radiculopathy, lumbar region: Secondary | ICD-10-CM | POA: Diagnosis not present

## 2021-12-05 MED ORDER — PREDNISONE 20 MG PO TABS: 20.0000 mg | ORAL_TABLET | Freq: Two times a day (BID) | ORAL | 0 refills | Status: AC

## 2021-12-05 NOTE — Progress Notes (Signed)
Subjective:    Patient ID: Valerie Kim, female    DOB: 1985-08-18, 36 y.o.   MRN: [id number]  Chief Complaint  Patient presents with   Hip Pain    Pt cut open rt pinky finger last night, pt thinks she may need stitches.    Back Pain    HPI Patient is in today for several concerns.  Last Tdap 08/19/20 - R pinky finger -cut last night while carving a pumpkin, stopped bleeding, wants it looked at today   Worse in the past 3 months - Low back pain into hips.  Can't stand or sit for extended periods of time. Feels "always painful" now. Wakes up at night from pain. Just feels like something won't pop. Tried ice / heat, showers, massage, nothing lasts more than a few hours. Advil helps sometimes. No known injury. No hx of MVAs.  Negative for any fevers, saddle anesthesia, foot-drop, incontinence of urine or stool, hx of cancer, and no recent injury.  She has done PT for her back previously, but it tends to make symptoms worse, and cost is expensive.  Other concerns in plan.   Past Medical History:  Diagnosis Date   Anxiety    Depression    Endometriosis    GERD (gastroesophageal reflux disease)    Migraines    Thyroid disease 2020   49m nodule noted and monitored through [place]   Past Surgical History:  Procedure Laterality Date   ABLATION     cardiac   HYSTEROSCOPY      Family History  Problem Relation Age of Onset   Hyperlipidemia Mother    Thyroid disease Mother    Depression Mother    Bipolar disorder Mother    Arthritis Mother    Miscarriages / [place]    Varicose Veins Mother    Hyperlipidemia Father    Alcohol abuse Father    Depression Father    Bipolar disorder Father    Arthritis Father    Hypertension Father    Hypertension Paternal Grandmother    Heart disease Paternal Grandmother    Heart disease Paternal Grandfather    Hypertension Paternal Grandfather    ADD / ADHD Son    ADD / ADHD Brother    Hypertension Brother    Learning  disabilities Brother    Anxiety disorder Daughter    Anxiety disorder Daughter     Social History   Tobacco Use   Smoking status: Former    Packs/day: 1.50    Years: 15.00    Total pack years: 22.50    Types: Cigarettes, E-cigarettes    Quit date: 02/07/2015    Years since quitting: 6.8   Smokeless tobacco: Never  Vaping Use   Vaping Use: Every day  Substance Use Topics   Alcohol use: Yes    Comment: Occassional social 1-2 drinks   Drug use: Yes    Frequency: 7.0 times per week    Types: Marijuana, Psilocybin     No Known Allergies  Review of Systems NEGATIVE UNLESS OTHERWISE INDICATED IN HPI      Objective:     BP 100/72 (BP Location: Left Arm, Patient Position: Sitting)   Pulse 81   Temp 97.7 F (36.5 C) (Temporal)   Ht '5\' 10"'$  (1.778 m)   Wt 193 lb (87.5 kg)   LMP 11/16/2021 (Exact Date)   SpO2 99%   BMI 27.69 kg/m   Wt Readings from Last 3 Encounters:  12/05/21 193 lb (87.5  kg)  09/05/21 209 lb 9.6 oz (95.1 kg)  08/01/20 217 lb 0.7 oz (98.5 kg)    BP Readings from Last 3 Encounters:  12/05/21 100/72  09/05/21 122/82  08/02/20 (!) 108/91     Physical Exam Constitutional:      Appearance: Normal appearance.  Cardiovascular:     Rate and Rhythm: Normal rate and regular rhythm.     Pulses: Normal pulses.     Heart sounds: No murmur heard. Pulmonary:     Effort: Pulmonary effort is normal.     Breath sounds: Normal breath sounds.  Musculoskeletal:     Cervical back: Spasms and tenderness present. Decreased range of motion.     Lumbar back: Negative right straight leg raise test and negative left straight leg raise test.     Right lower leg: No edema.     Left lower leg: No edema.  Skin:    Findings: No rash.     Comments: Very small healed abrasion approx 3 mm proximal R pinky finger  Neurological:     General: No focal deficit present.     Mental Status: She is alert and oriented to person, place, and time.  Psychiatric:        Mood and  Affect: Mood is depressed.        Assessment & Plan:  Paroxysmal SVT (supraventricular tachycardia) Assessment & Plan: History of this stated by patient. Some more recurrent episodes lately. She would like to see cardiology. Referral sent.   Orders: -     Ambulatory referral to Cardiology  Depression, recurrent [facility]) Assessment & Plan: Worse Not taking any medications Referral to psychiatry at this time No SI today [facility] info provided in AVS  Orders: -     Ambulatory referral to Psychiatry  Anxiety -     Ambulatory referral to Psychiatry  Chronic bilateral low back pain without sciatica Assessment & Plan: Please go to [facility] for an x-ray of your low back Take the prednisone as directed Try lidocaine patches at home, gently stretch and ice or heat whichever feels better We have PT at our office if you are interested in this at all, they do great work Sports medicine may be able to help as well   Orders: -     DG Lumbar Spine Complete; Future  Abrasion of right little finger, initial encounter  Other orders -     predniSONE; Take 1 tablet (20 mg total) by mouth 2 (two) times daily with a meal for 5 days.  Dispense: 10 tablet; Refill: 0   Abrasion little finger - keep clean and dry, should continue to heal nicely on its own.     Return if symptoms worsen or fail to improve.  This note was prepared with assistance of Systems analyst. Occasional wrong-word or sound-a-like substitutions may have occurred due to the inherent limitations of voice recognition software.    Janecia Palau M Matraca Hunkins, PA-C

## 2021-12-05 NOTE — Patient Instructions (Addendum)
Good to see you today Referrals placed to psychiatry and cardiology  Please go to [facility] for an x-ray of your low back Take the prednisone as directed Try lidocaine patches at home, gently stretch and ice or heat whichever feels better We have PT at our office if you are interested in this at all, they do great work Sports medicine may be able to help as well   Keep area clean and dry on your finger.  Let me know if any problems.   If you develop suicidal thoughts, please tell someone, call 14, and immediately proceed to our local 24/7 crisis center, [facility] at the [facility].[address], [place], [place]) [phone].

## 2021-12-10 NOTE — Assessment & Plan Note (Signed)
History of this stated by patient. Some more recurrent episodes lately. She would like to see cardiology. Referral sent.

## 2021-12-10 NOTE — Assessment & Plan Note (Signed)
Worse Not taking any medications Referral to psychiatry at this time No SI today [facility] info provided in AVS

## 2021-12-10 NOTE — Assessment & Plan Note (Signed)
Please go to [facility] for an x-ray of your low back Take the prednisone as directed Try lidocaine patches at home, gently stretch and ice or heat whichever feels better We have PT at our office if you are interested in this at all, they do great work Sports medicine may be able to help as well

## 2021-12-12 NOTE — Progress Notes (Signed)
Cardiology Office Note:    Date:  12/15/2021   ID:  Valerie Kim, DOB 1985/08/12, MRN [id number]  PCP:  Allwardt, Randa Evens, PA-C   [facility] Providers Cardiologist:  Lenna Sciara, MD Referring MD: Fredirick Lathe, PA-C   Chief Complaint/Reason for Referral:  SVT  ASSESSMENT:    1. SVT (supraventricular tachycardia)   2. Palpitations     PLAN:    In order of problems listed above: 1.  SVT: She has had an ablation in the past.  She has some occasional palpitations but these are not very long-lasting.  We will monitor for now. 2.  Palpitations: They are relatively infrequent.  Monitor for now.              Dispo:  Return in about 1 year (around 12/16/2022).      Medication Adjustments/Labs and Tests Ordered: Current medicines are reviewed at length with the patient today.  Concerns regarding medicines are outlined above.  The following changes have been made:  no change   Labs/tests ordered: Orders Placed This Encounter  Procedures   EKG 12-Lead    Medication Changes: No orders of the defined types were placed in this encounter.    Current medicines are reviewed at length with the patient today.  The patient does not have concerns regarding medicines.   History of Present Illness:    FOCUSED PROBLEM LIST:   1.  Migraine disorder 2.  GERD 3.  SVT s/p ablation 2012  The patient is a 36 y.o. female with the indicated medical history here for recommendations regarding palpitations.  The patient was seen by her primary care provider's office recently.  She was complaining of low back pain hip pain.  She stated that she has a history of SVT desired to be seen by cardiology.  The patient had an ablation for SVT in [place] in 2012.  Since that time she has been fairly well.  She is had 4 successful pregnancies without any issues.  She hikes and does her activities of daily living without any issues.  She occasionally gets a very sharp twinges of chest  discomfort that lasts seconds.  They are not associated with exertion.  She hikes with her family without any issues.  She is otherwise well without significant complaints.          Current Medications: No outpatient medications have been marked as taking for the 12/15/21 encounter (Office Visit) with [name], MD.     Allergies:    Patient has no known allergies.   Social History:   Social History   Tobacco Use   Smoking status: Former    Packs/day: 1.50    Years: 15.00    Total pack years: 22.50    Types: Cigarettes, E-cigarettes    Quit date: 02/07/2015    Years since quitting: 6.8   Smokeless tobacco: Never  Vaping Use   Vaping Use: Every day  Substance Use Topics   Alcohol use: Yes    Comment: Occassional social 1-2 drinks   Drug use: Yes    Frequency: 7.0 times per week    Types: Marijuana, Psilocybin     Family Hx: Family History  Problem Relation Age of Onset   Hyperlipidemia Mother    Thyroid disease Mother    Depression Mother    Bipolar disorder Mother    Arthritis Mother    Miscarriages / [place] Mother    Varicose Veins Mother    Hyperlipidemia Father  Alcohol abuse Father    Depression Father    Bipolar disorder Father    Arthritis Father    Hypertension Father    Hypertension Paternal Grandmother    Heart disease Paternal Grandmother    Heart disease Paternal Grandfather    Hypertension Paternal Grandfather    ADD / ADHD Son    ADD / ADHD Brother    Hypertension Brother    Learning disabilities Brother    Anxiety disorder Daughter    Anxiety disorder Daughter      Review of Systems:   Please see the history of present illness.    All other systems reviewed and are negative.     EKGs/Labs/Other Test Reviewed:    EKG:  EKG performed today that I personally reviewed demonstrates sinus rhythm.  Prior CV studies: None available  Other studies Reviewed: Review of the additional studies/records demonstrates: No imaging  evidence of coronary calcification or aortic atherosclerosis available.  Recent Labs: 09/05/2021: ALT 26; BUN 7; Creatinine, Ser 0.76; Hemoglobin 14.0; Platelets 281.0; Potassium 4.0; Sodium 143; TSH 1.48   Recent Lipid Panel Lab Results  Component Value Date/Time   CHOL 168 09/05/2021 09:50 AM   CHOL 207 (H) 08/19/2020 12:58 PM   TRIG 90.0 09/05/2021 09:50 AM   HDL 43.20 09/05/2021 09:50 AM   HDL 45 08/19/2020 12:58 PM   LDLCALC 107 (H) 09/05/2021 09:50 AM   LDLCALC 134 (H) 08/19/2020 12:58 PM    Risk Assessment/Calculations:                Physical Exam:    VS:  BP 102/64   Pulse 71   Ht '5\' 10"'$  (1.778 m)   Wt 196 lb 12.8 oz (89.3 kg)   LMP 11/16/2021 (Exact Date)   SpO2 99%   BMI 28.24 kg/m    Wt Readings from Last 3 Encounters:  12/15/21 196 lb 12.8 oz (89.3 kg)  12/05/21 193 lb (87.5 kg)  09/05/21 209 lb 9.6 oz (95.1 kg)    GENERAL:  No apparent distress, AOx3 HEENT:  No carotid bruits, +2 carotid impulses, no scleral icterus CAR: RRR no murmurs, gallops, rubs, or thrills RES:  Clear to auscultation bilaterally ABD:  Soft, nontender, nondistended, positive bowel sounds x 4 VASC:  +2 radial pulses, +2 carotid pulses, palpable pedal pulses NEURO:  CN 2-12 grossly intact; motor and sensory grossly intact PSYCH:  No active depression or anxiety EXT:  No edema, ecchymosis, or cyanosis  Signed, [name], MD  12/15/2021 5:36 PM    [address] [place], [place], [place]  [address] Phone: [phone]; Fax: [phone]   Note:  This document was prepared using Dragon voice recognition software and may include unintentional dictation errors.

## 2021-12-14 ENCOUNTER — Encounter: Payer: Self-pay | Admitting: Physician Assistant

## 2021-12-15 ENCOUNTER — Ambulatory Visit: Payer: BC Managed Care – PPO | Attending: Internal Medicine | Admitting: Internal Medicine

## 2021-12-15 ENCOUNTER — Encounter: Payer: Self-pay | Admitting: Internal Medicine

## 2021-12-15 VITALS — BP 102/64 | HR 71 | Ht 70.0 in | Wt 196.8 lb

## 2021-12-15 DIAGNOSIS — I471 Supraventricular tachycardia, unspecified: Secondary | ICD-10-CM

## 2021-12-15 DIAGNOSIS — R002 Palpitations: Secondary | ICD-10-CM | POA: Diagnosis not present

## 2021-12-15 NOTE — Patient Instructions (Signed)
Medication Instructions:  No changes *If you need a refill on your cardiac medications before your next appointment, please call your pharmacy*   Lab Work: none If you have labs (blood work) drawn today and your tests are completely normal, you will receive your results only by: [place] (if you have MyChart) OR A paper copy in the mail If you have any lab test that is abnormal or we need to change your treatment, we will call you to review the results.   Testing/Procedures: none   Follow-Up: At [facility], you and your health needs are our priority.  As part of our continuing mission to provide you with exceptional heart care, we have created designated Provider Care Teams.  These Care Teams include your primary Cardiologist (physician) and Advanced Practice Providers (APPs -  Physician Assistants and Nurse Practitioners) who all work together to provide you with the care you need, when you need it.  We recommend signing up for the patient portal called "MyChart".  Sign up information is provided on this After Visit Summary.  MyChart is used to connect with patients for Virtual Visits (Telemedicine).  Patients are able to view lab/test results, encounter notes, upcoming appointments, etc.  Non-urgent messages can be sent to your provider as well.   To learn more about what you can do with MyChart, go to [email or web address].    Your next appointment:   12 month(s)  The format for your next appointment:   In Person  Provider:   Lenna Sciara, MD   Other Instructions   Important Information About Sugar

## 2022-02-27 ENCOUNTER — Ambulatory Visit (HOSPITAL_BASED_OUTPATIENT_CLINIC_OR_DEPARTMENT_OTHER): Payer: BC Managed Care – PPO | Admitting: Student in an Organized Health Care Education/Training Program

## 2022-02-27 ENCOUNTER — Encounter (HOSPITAL_COMMUNITY): Payer: Self-pay | Admitting: Student in an Organized Health Care Education/Training Program

## 2022-02-27 VITALS — BP 127/81 | HR 77 | Resp 12 | Wt 180.0 lb

## 2022-02-27 DIAGNOSIS — F4312 Post-traumatic stress disorder, chronic: Secondary | ICD-10-CM

## 2022-02-27 DIAGNOSIS — F411 Generalized anxiety disorder: Secondary | ICD-10-CM | POA: Diagnosis not present

## 2022-02-27 MED ORDER — HYDROXYZINE HCL 25 MG PO TABS: 25.0000 mg | ORAL_TABLET | Freq: Three times a day (TID) | ORAL | 2 refills | Status: DC | PRN

## 2022-02-27 NOTE — Patient Instructions (Signed)
Please contact one of the following facilities to start medication management and therapy services:  ? ?[facility] ?[address]  ?[place], [place] [address] ?[phone]  ? ?[facility]  ?[address] ?[place], [place] [address] ?[phone] ? ?[facility]  ?[address], [place], [place] [address] ?[phone] ? ?[facility]  ?[address]  ?[place], [place] [address] ?[phone] ? ?[facility]  ?[address] ?[place], [place] [address] ?[phone] ? ?[facility]  ?[address],  ?, [place] [address] ?[phone]  ?
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
## 2022-03-15 ENCOUNTER — Ambulatory Visit (INDEPENDENT_AMBULATORY_CARE_PROVIDER_SITE_OTHER): Payer: BC Managed Care – PPO | Admitting: Clinical

## 2022-03-15 DIAGNOSIS — F4312 Post-traumatic stress disorder, chronic: Secondary | ICD-10-CM

## 2022-03-15 DIAGNOSIS — F419 Anxiety disorder, unspecified: Secondary | ICD-10-CM | POA: Diagnosis not present

## 2022-03-15 DIAGNOSIS — F331 Major depressive disorder, recurrent, moderate: Secondary | ICD-10-CM

## 2022-03-15 DIAGNOSIS — F1210 Cannabis abuse, uncomplicated: Secondary | ICD-10-CM | POA: Diagnosis not present

## 2022-03-16 ENCOUNTER — Encounter (HOSPITAL_COMMUNITY): Payer: Self-pay | Admitting: Clinical

## 2022-03-16 ENCOUNTER — Encounter (HOSPITAL_COMMUNITY): Payer: Self-pay
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
## 2022-03-29 ENCOUNTER — Ambulatory Visit (INDEPENDENT_AMBULATORY_CARE_PROVIDER_SITE_OTHER): Payer: BC Managed Care – PPO | Admitting: Clinical

## 2022-03-29 DIAGNOSIS — F331 Major depressive disorder, recurrent, moderate: Secondary | ICD-10-CM

## 2022-03-29 DIAGNOSIS — F1210 Cannabis abuse, uncomplicated: Secondary | ICD-10-CM | POA: Diagnosis not present

## 2022-03-29 DIAGNOSIS — F4312 Post-traumatic stress disorder, chronic: Secondary | ICD-10-CM

## 2022-03-29 DIAGNOSIS — F411 Generalized anxiety disorder: Secondary | ICD-10-CM
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
## 2022-04-10 ENCOUNTER — Ambulatory Visit: Payer: BC Managed Care – PPO | Admitting: Physician Assistant

## 2022-04-12 ENCOUNTER — Ambulatory Visit (HOSPITAL_COMMUNITY): Payer: BC Managed Care – PPO | Admitting: Clinical

## 2022-04-19 ENCOUNTER — Encounter: Payer: Self-pay | Admitting: Physician Assistant

## 2022-04-19 ENCOUNTER — Ambulatory Visit (INDEPENDENT_AMBULATORY_CARE_PROVIDER_SITE_OTHER): Payer: BC Managed Care – PPO | Admitting: Physician Assistant

## 2022-04-19 VITALS — BP 100/62 | HR 62 | Temp 97.8°F | Ht 70.0 in | Wt 180.6 lb

## 2022-04-19 DIAGNOSIS — N809 Endometriosis, unspecified: Secondary | ICD-10-CM

## 2022-04-19 DIAGNOSIS — K219 Gastro-esophageal reflux disease without esophagitis: Secondary | ICD-10-CM | POA: Diagnosis not present

## 2022-04-19 DIAGNOSIS — M79632 Pain in left forearm: Secondary | ICD-10-CM

## 2022-04-19 MED ORDER — OMEPRAZOLE 20 MG PO CPDR: 20.0000 mg | DELAYED_RELEASE_CAPSULE | Freq: Every day | ORAL | 3 refills | Status: DC

## 2022-04-19 MED ORDER — DICLOFENAC SODIUM 1 % EX GEL: 2.0000 g | Freq: Four times a day (QID) | CUTANEOUS | 2 refills | Status: DC

## 2022-04-19 NOTE — Progress Notes (Signed)
Subjective:    Patient ID: Valerie Kim, female    DOB: 1985-03-28, 37 y.o.   MRN: [id number]     HPI Patient is in today for mid-epigastric pain the last few months. Steadily worsening. Tums sometimes helps. Eating anything seems to flare symptoms. Somewhat stabbing, kind of burning pain. Hx of GERD after 3rd child, never seemed to fully go away. She was on a prescription strength med for GERD x 3 years, stopped about 5 years ago. Nausea more often.   No fever or chills. No night sweats.   Some weakness in left arm - around forearm into fingertips - sometimes hard to grasp a pitcher. Going on the last few months.  Sometimes goes up the back of her arm as well. Also has a large cyst in this wrist - she's wondering if this is compressing a nerve. Not taking any NSAIDs.   Past Medical History:  Diagnosis Date   Annual physical exam 08/19/2020   Anxiety    Depression    Endometriosis    GERD (gastroesophageal reflux disease)    Migraines    Thyroid disease 2020   93m nodule noted and monitored through [place]   Past Surgical History:  Procedure Laterality Date   ABLATION     cardiac   HYSTEROSCOPY      Family History  Problem Relation Age of Onset   Hyperlipidemia Mother    Thyroid disease Mother    Depression Mother    Bipolar disorder Mother    Arthritis Mother    Miscarriages / [place]    Varicose Veins Mother    Hyperlipidemia Father    Alcohol abuse Father    Depression Father    Bipolar disorder Father    Arthritis Father    Hypertension Father    Hypertension Paternal Grandmother    Heart disease Paternal Grandmother    Heart disease Paternal Grandfather    Hypertension Paternal Grandfather    ADD / ADHD Son    ADD / ADHD Brother    Hypertension Brother    Learning disabilities Brother    Anxiety disorder Daughter    Anxiety disorder Daughter     Social History   Tobacco Use   Smoking status: Former    Packs/day: 1.50    Years: 15.00     Total pack years: 22.50    Types: Cigarettes, E-cigarettes    Quit date: 02/07/2015    Years since quitting: 7.2   Smokeless tobacco: Never  Vaping Use   Vaping Use: Every day  Substance Use Topics   Alcohol use: Yes    Comment: Occassional social 1-2 drinks   Drug use: Yes    Frequency: 7.0 times per week    Types: Marijuana, Psilocybin     No Known Allergies  Review of Systems NEGATIVE UNLESS OTHERWISE INDICATED IN HPI      Objective:     BP 100/62   Pulse 62   Temp 97.8 F (36.6 C)   Ht '5\' 10"'$  (1.778 m)   Wt 180 lb 9.6 oz (81.9 kg)   SpO2 100%   BMI 25.91 kg/m   Wt Readings from Last 3 Encounters:  04/19/22 180 lb 9.6 oz (81.9 kg)  12/15/21 196 lb 12.8 oz (89.3 kg)  12/05/21 193 lb (87.5 kg)    BP Readings from Last 3 Encounters:  04/19/22 100/62  12/15/21 102/64  12/05/21 100/72     Physical Exam Vitals and nursing note reviewed.  Constitutional:  Appearance: Normal appearance. She is normal weight. She is not toxic-appearing.  HENT:     Head: Normocephalic and atraumatic.  Eyes:     Extraocular Movements: Extraocular movements intact.     Conjunctiva/sclera: Conjunctivae normal.     Pupils: Pupils are equal, round, and reactive to light.  Cardiovascular:     Rate and Rhythm: Normal rate and regular rhythm.     Pulses: Normal pulses.     Heart sounds: Normal heart sounds.  Pulmonary:     Effort: Pulmonary effort is normal.     Breath sounds: Normal breath sounds.  Abdominal:     General: Abdomen is flat. Bowel sounds are normal. There is no distension.     Palpations: Abdomen is soft. There is no mass.     Tenderness: There is abdominal tenderness (Slightly tender epigastric region). There is no right CVA tenderness, left CVA tenderness or guarding.     Hernia: No hernia is present.  Musculoskeletal:        General: Normal range of motion.       Arms:     Cervical back: Normal range of motion and neck supple.  Skin:    General: Skin  is warm and dry.  Neurological:     General: No focal deficit present.     Mental Status: She is alert and oriented to person, place, and time.  Psychiatric:        Mood and Affect: Mood normal.        Behavior: Behavior normal.        Assessment & Plan:  Gastroesophageal reflux disease without esophagitis  Left forearm pain  Endometriosis -     Ambulatory referral to Gynecology  Other orders -     Omeprazole; Take 1 capsule (20 mg total) by mouth daily. Take on an empty stomach about 30 minutes prior to meal.  Dispense: 30 capsule; Refill: 3 -     Diclofenac Sodium; Apply 2 g topically 4 (four) times daily.  Dispense: 100 g; Refill: 2    GERD - She has a history of GERD.  I think this is flaring up again and causing her recurrent epigastric issues.  Plan to start on omeprazole 20 mg once daily on an empty stomach.  She can increase it to twice daily if needed.  Remove offending agents such as caffeine, nicotine, spicy foods.  Recheck in 3 to 4 weeks.  If worse or no improvement, plan for labs and ultrasound.  Left forearm pain- Could be some tendinitis.  Advised Voltaren gel 3-4 times daily.  Ice and gentle stretches.  Consider sports med or EMG study if worsening or improvement.  History of endometriosis -she has not been able to establish with gynecology.  Referral sent to OB/GYN for her today.    Return in about 3 weeks (around 05/10/2022) for recheck on symptoms .     Ilay Capshaw M Fate Caster, PA-C

## 2022-04-26 ENCOUNTER — Ambulatory Visit (HOSPITAL_COMMUNITY): Payer: BC Managed Care – PPO | Admitting: Clinical

## 2022-05-10 ENCOUNTER — Ambulatory Visit: Payer: BC Managed Care – PPO | Admitting: Physician Assistant

## 2022-05-23 ENCOUNTER — Ambulatory Visit (INDEPENDENT_AMBULATORY_CARE_PROVIDER_SITE_OTHER): Payer: BC Managed Care – PPO | Admitting: Physician Assistant

## 2022-05-23 VITALS — BP 118/74 | HR 72 | Temp 97.3°F | Ht 70.0 in | Wt 181.2 lb

## 2022-05-23 DIAGNOSIS — F339 Major depressive disorder, recurrent, unspecified: Secondary | ICD-10-CM | POA: Diagnosis not present

## 2022-05-23 DIAGNOSIS — K219 Gastro-esophageal reflux disease without esophagitis: Secondary | ICD-10-CM | POA: Diagnosis not present

## 2022-05-23 DIAGNOSIS — M47816 Spondylosis without myelopathy or radiculopathy, lumbar region: Secondary | ICD-10-CM

## 2022-05-23 DIAGNOSIS — G5622 Lesion of ulnar nerve, left upper limb: Secondary | ICD-10-CM

## 2022-05-23 MED ORDER — METHYLPREDNISOLONE 4 MG PO TBPK: ORAL_TABLET | ORAL | 0 refills | Status: DC

## 2022-05-23 NOTE — Assessment & Plan Note (Signed)
Not sure what level issues are coming from - neck ? Shoulder ? Elbow? Seems to be affecting entire LUE now She is losing grip strength, having more pain and weakness Will have her see sports med for 2nd opinion, may need EMG, imaging.Marland Kitchen

## 2022-05-23 NOTE — Assessment & Plan Note (Signed)
Flowsheet Row Office Visit from 05/23/2022 in [facility]  PHQ-9 Total Score 14      Chronic, no SI or HI, pt states she is "fine"; didn't like meds in the past, made her too numb. Will let me know if needing additional help.

## 2022-05-23 NOTE — Assessment & Plan Note (Signed)
Stable She is taking Prilosec 20 mg BID Diet changes have helped She will let me know if worse /any changes

## 2022-05-23 NOTE — Assessment & Plan Note (Signed)
Per XRAY Having more pain recently Will start on medrol dose pak F/up with sports med or spine and scoliosis

## 2022-05-23 NOTE — Progress Notes (Signed)
Subjective:    Patient ID: Valerie Kim, female    DOB: 1986-02-04, 37 y.o.   MRN: [id number]  Chief Complaint  Patient presents with   Follow-up    Pt in the office for follow up for symptom recheck; pt states meds has improved symptoms but still present; pt states Voltaren is helping right at the joint but not anywhere else and would like to discuss more. Also discuss Xray on back and referral    HPI Patient is in today for recheck on GERD, arm pain, XRAY.  See A/P.    Past Medical History:  Diagnosis Date   Annual physical exam 08/19/2020   Anxiety    Depression    Endometriosis    GERD (gastroesophageal reflux disease)    Migraines    Thyroid disease 2020   3mm nodule noted and monitored through [place]    Past Surgical History:  Procedure Laterality Date   ABLATION     cardiac   HYSTEROSCOPY      Family History  Problem Relation Age of Onset   Hyperlipidemia Mother    Thyroid disease Mother    Depression Mother    Bipolar disorder Mother    Arthritis Mother    Miscarriages / [place] Mother    Varicose Veins Mother    Hyperlipidemia Father    Alcohol abuse Father    Depression Father    Bipolar disorder Father    Arthritis Father    Hypertension Father    Hypertension Paternal Grandmother    Heart disease Paternal Grandmother    Heart disease Paternal Grandfather    Hypertension Paternal Grandfather    ADD / ADHD Son    ADD / ADHD Brother    Hypertension Brother    Learning disabilities Brother    Anxiety disorder Daughter    Anxiety disorder Daughter     Social History   Tobacco Use   Smoking status: Former    Packs/day: 1.50    Years: 15.00    Additional pack years: 0.00    Total pack years: 22.50    Types: Cigarettes, E-cigarettes    Quit date: 02/07/2015    Years since quitting: 7.2   Smokeless tobacco: Never  Vaping Use   Vaping Use: Every day  Substance Use Topics   Alcohol use: Yes    Comment: Occassional social 1-2 drinks    Drug use: Yes    Frequency: 7.0 times per week    Types: Marijuana, Psilocybin     No Known Allergies  Review of Systems NEGATIVE UNLESS OTHERWISE INDICATED IN HPI      Objective:     BP 118/74 (BP Location: Left Arm)   Pulse 72   Temp (!) 97.3 F (36.3 C) (Temporal)   Ht  (1.778 m)   Wt 181 lb 3.2 oz (82.2 kg)   SpO2 99%   BMI 26.00 kg/m   Wt Readings from Last 3 Encounters:  05/23/22 181 lb 3.2 oz (82.2 kg)  04/19/22 180 lb 9.6 oz (81.9 kg)  12/15/21 196 lb 12.8 oz (89.3 kg)    BP Readings from Last 3 Encounters:  05/23/22 118/74  04/19/22 100/62  12/15/21 102/64     Physical Exam Vitals and nursing note reviewed.  Constitutional:      Appearance: Normal appearance. She is normal weight. She is not toxic-appearing.  HENT:     Head: Normocephalic and atraumatic.  Eyes:     Extraocular Movements: Extraocular movements intact.  Conjunctiva/sclera: Conjunctivae normal.     Pupils: Pupils are equal, round, and reactive to light.  Cardiovascular:     Rate and Rhythm: Normal rate and regular rhythm.     Pulses: Normal pulses.     Heart sounds: Normal heart sounds.  Pulmonary:     Effort: Pulmonary effort is normal.     Breath sounds: Normal breath sounds.  Musculoskeletal:        General: Normal range of motion.       Arms:     Cervical back: Normal range of motion and neck supple.  Skin:    General: Skin is warm and dry.  Neurological:     General: No focal deficit present.     Mental Status: She is alert and oriented to person, place, and time.  Psychiatric:        Mood and Affect: Mood normal.        Behavior: Behavior normal.        Assessment & Plan:  Gastroesophageal reflux disease without esophagitis Assessment & Plan: Stable She is taking Prilosec 20 mg BID Diet changes have helped She will let me know if worse /any changes   Lumbar spondylosis Assessment & Plan: Per XRAY Having more pain recently Will start on medrol  dose pak F/up with sports med or spine and scoliosis  Orders: -     Ambulatory referral to Sports Medicine  Ulnar neuropathy of left upper extremity Assessment & Plan: Not sure what level issues are coming from - neck ? Shoulder ? Elbow? Seems to be affecting entire LUE now She is losing grip strength, having more pain and weakness Will have her see sports med for 2nd opinion, may need EMG, imaging..   Orders: -     Ambulatory referral to Sports Medicine  Depression, recurrent Assessment & Plan: Flowsheet Row Office Visit from 05/23/2022 in [facility]  PHQ-9 Total Score 14      Chronic, no SI or HI, pt states she is "fine"; didn't like meds in the past, made her too numb. Will let me know if needing additional help.    Other orders -     methylPREDNISolone; Please take per packaging instructions.  Dispense: 21 tablet; Refill: 0        Return in about 1 year (around 05/23/2023) for CPE, fasting labs .    Aleksandra Raben M Hyland Mollenkopf, PA-C

## 2022-05-24 NOTE — Progress Notes (Unsigned)
    Aleen Sells [facility] [address] [place] [address] Phone: [phone]   Assessment and Plan:     There are no diagnoses linked to this encounter.  ***   Pertinent previous records reviewed include ***   Follow Up: ***     Subjective:   I, Shasha Buchbinder, am serving as a Neurosurgeon for Doctor Richardean Sale  Chief Complaint: low back pain   HPI:   05/25/2022 Patient is  37 year old female complaining of low back pain. Patient states  Relevant Historical Information: ***  Additional pertinent review of systems negative.   Current Outpatient Medications:    diclofenac Sodium (VOLTAREN) 1 % GEL, Apply 2 g topically 4 (four) times daily., Disp: 100 g, Rfl: 2   methylPREDNISolone (MEDROL DOSEPAK) 4 MG TBPK tablet, Please take per packaging instructions., Disp: 21 tablet, Rfl: 0   omeprazole (PRILOSEC) 20 MG capsule, Take 1 capsule (20 mg total) by mouth daily. Take on an empty stomach about 30 minutes prior to meal., Disp: 30 capsule, Rfl: 3   Objective:     There were no vitals filed for this visit.    There is no height or weight on file to calculate BMI.    Physical Exam:    ***   Electronically signed by:  Aleen Sells [facility] 7:28 AM 05/24/22

## 2022-05-25 ENCOUNTER — Ambulatory Visit (INDEPENDENT_AMBULATORY_CARE_PROVIDER_SITE_OTHER): Payer: BC Managed Care – PPO | Admitting: Sports Medicine

## 2022-05-25 ENCOUNTER — Ambulatory Visit (INDEPENDENT_AMBULATORY_CARE_PROVIDER_SITE_OTHER): Payer: BC Managed Care – PPO

## 2022-05-25 VITALS — BP 108/80 | Ht 70.0 in | Wt 178.0 lb

## 2022-05-25 DIAGNOSIS — M542 Cervicalgia: Secondary | ICD-10-CM | POA: Diagnosis not present

## 2022-05-25 DIAGNOSIS — M5412 Radiculopathy, cervical region: Secondary | ICD-10-CM

## 2022-05-25 DIAGNOSIS — G8929 Other chronic pain: Secondary | ICD-10-CM

## 2022-05-25 DIAGNOSIS — M7712 Lateral epicondylitis, left elbow: Secondary | ICD-10-CM

## 2022-05-25 DIAGNOSIS — M545 Low back pain, unspecified: Secondary | ICD-10-CM

## 2022-05-25 DIAGNOSIS — M2550 Pain in unspecified joint: Secondary | ICD-10-CM

## 2022-05-25 LAB — CBC WITH DIFFERENTIAL/PLATELET
Basophils Absolute: 0 10*3/uL (ref 0.0–0.1)
Basophils Relative: 0.2 % (ref 0.0–3.0)
Eosinophils Absolute: 0 10*3/uL (ref 0.0–0.7)
Eosinophils Relative: 0.1 % (ref 0.0–5.0)
HCT: 39.4 % (ref 36.0–46.0)
Hemoglobin: 13.4 g/dL (ref 12.0–15.0)
Lymphocytes Relative: 16.1 % (ref 12.0–46.0)
Lymphs Abs: 1.6 10*3/uL (ref 0.7–4.0)
MCHC: 34.1 g/dL (ref 30.0–36.0)
MCV: 88.6 fl (ref 78.0–100.0)
Monocytes Absolute: 0.7 10*3/uL (ref 0.1–1.0)
Monocytes Relative: 6.8 % (ref 3.0–12.0)
Neutro Abs: 7.5 10*3/uL (ref 1.4–7.7)
Neutrophils Relative %: 76.8 % (ref 43.0–77.0)
Platelets: 373 10*3/uL (ref 150.0–400.0)
RBC: 4.44 Mil/uL (ref 3.87–5.11)
RDW: 12.8 % (ref 11.5–15.5)
WBC: 9.8 10*3/uL (ref 4.0–10.5)

## 2022-05-25 LAB — COMPREHENSIVE METABOLIC PANEL
ALT: 26 U/L (ref 0–35)
AST: 20 U/L (ref 0–37)
Albumin: 4.5 g/dL (ref 3.5–5.2)
Alkaline Phosphatase: 95 U/L (ref 39–117)
BUN: 7 mg/dL (ref 6–23)
CO2: 29 mEq/L (ref 19–32)
Calcium: 9.5 mg/dL (ref 8.4–10.5)
Chloride: 102 mEq/L (ref 96–112)
Creatinine, Ser: 0.74 mg/dL (ref 0.40–1.20)
GFR: 103 mL/min (ref >60.00)
Glucose, Bld: 100 mg/dL — ABNORMAL HIGH (ref 70–99)
Potassium: 3.5 mEq/L (ref 3.5–5.1)
Sodium: 138 mEq/L (ref 135–145)
Total Bilirubin: 0.8 mg/dL (ref 0.2–1.2)
Total Protein: 7.1 g/dL (ref 6.0–8.3)

## 2022-05-25 LAB — C-REACTIVE PROTEIN: CRP: <1 mg/dL (ref 0.5–20.0)

## 2022-05-25 LAB — URIC ACID: Uric Acid, Serum: 3.6 mg/dL (ref 2.4–7.0)

## 2022-05-25 LAB — SEDIMENTATION RATE: Sed Rate: 6 mm/hr (ref 0–20)

## 2022-05-25 LAB — FERRITIN: Ferritin: 35 ng/mL (ref 10.0–291.0)

## 2022-05-25 LAB — TSH: TSH: 1 u[IU]/mL (ref 0.35–5.50)

## 2022-05-25 LAB — VITAMIN D 25 HYDROXY (VIT D DEFICIENCY, FRACTURES): VITD: 31.97 ng/mL (ref 30.00–100.00)

## 2022-05-25 MED ORDER — MELOXICAM 15 MG PO TABS: 15.0000 mg | ORAL_TABLET | Freq: Every day | ORAL | 0 refills | Status: DC

## 2022-05-25 NOTE — Patient Instructions (Addendum)
Good to see you  - Start meloxicam 15 mg daily x2 weeks.  If still having pain after 2 weeks, complete 3rd-week of meloxicam. May use remaining meloxicam as needed once daily for pain control.  Do not to use additional NSAIDs while taking meloxicam.  May use Tylenol [phone] mg 2 to 3 times a day for breakthrough pain. Start after completing prednisone course Neck , elbow low back HE[facility] Labs on the way out  3-4 week follow up

## 2022-05-28 LAB — CYCLIC CITRUL PEPTIDE ANTIBODY, IGG: Cyclic Citrullin Peptide Ab: <16 UNITS

## 2022-05-28 LAB — RHEUMATOID FACTOR: Rheumatoid fact SerPl-aCnc: <10 IU/mL (ref <14)

## 2022-05-28 LAB — ANA: Anti Nuclear Antibody (ANA): NEGATIVE

## 2022-06-16 ENCOUNTER — Ambulatory Visit: Payer: BC Managed Care – PPO | Admitting: Sports Medicine

## 2022-06-26 DIAGNOSIS — Z01419 Encounter for gynecological examination (general) (routine) without abnormal findings: Secondary | ICD-10-CM | POA: Diagnosis not present

## 2022-06-26 DIAGNOSIS — R300 Dysuria: Secondary | ICD-10-CM | POA: Diagnosis not present

## 2022-06-26 DIAGNOSIS — Z124 Encounter for screening for malignant neoplasm of cervix: Secondary | ICD-10-CM | POA: Diagnosis not present

## 2022-09-26 DIAGNOSIS — L578 Other skin changes due to chronic exposure to nonionizing radiation: Secondary | ICD-10-CM | POA: Diagnosis not present

## 2022-09-26 DIAGNOSIS — L814 Other melanin hyperpigmentation: Secondary | ICD-10-CM | POA: Diagnosis not present

## 2022-09-26 DIAGNOSIS — Z808 Family history of malignant neoplasm of other organs or systems: Secondary | ICD-10-CM | POA: Diagnosis not present

## 2022-09-26 DIAGNOSIS — D2230 Melanocytic nevi of unspecified part of face: Secondary | ICD-10-CM | POA: Diagnosis not present

## 2022-10-11 ENCOUNTER — Ambulatory Visit: Payer: BC Managed Care – PPO | Admitting: Physician Assistant

## 2022-10-11 ENCOUNTER — Encounter: Payer: Self-pay | Admitting: Physician Assistant

## 2022-10-11 VITALS — BP 111/77 | HR 77 | Temp 97.5°F | Ht 70.0 in | Wt 171.6 lb

## 2022-10-11 DIAGNOSIS — R1013 Epigastric pain: Secondary | ICD-10-CM

## 2022-10-11 DIAGNOSIS — N809 Endometriosis, unspecified: Secondary | ICD-10-CM

## 2022-10-11 DIAGNOSIS — R634 Abnormal weight loss: Secondary | ICD-10-CM | POA: Diagnosis not present

## 2022-10-11 DIAGNOSIS — F419 Anxiety disorder, unspecified: Secondary | ICD-10-CM

## 2022-10-11 DIAGNOSIS — R519 Headache, unspecified: Secondary | ICD-10-CM

## 2022-10-11 DIAGNOSIS — R1319 Other dysphagia: Secondary | ICD-10-CM | POA: Diagnosis not present

## 2022-10-11 DIAGNOSIS — Z23 Encounter for immunization: Secondary | ICD-10-CM

## 2022-10-11 LAB — CBC WITH DIFFERENTIAL/PLATELET
Basophils Absolute: 0 10*3/uL (ref 0.0–0.1)
Basophils Relative: 0.6 % (ref 0.0–3.0)
Eosinophils Absolute: 0.1 10*3/uL (ref 0.0–0.7)
Eosinophils Relative: 1.9 % (ref 0.0–5.0)
HCT: 41.9 % (ref 36.0–46.0)
Hemoglobin: 14 g/dL (ref 12.0–15.0)
Lymphocytes Relative: 33.1 % (ref 12.0–46.0)
Lymphs Abs: 1.9 10*3/uL (ref 0.7–4.0)
MCHC: 33.5 g/dL (ref 30.0–36.0)
MCV: 88.8 fl (ref 78.0–100.0)
Monocytes Absolute: 0.7 10*3/uL (ref 0.1–1.0)
Monocytes Relative: 11.4 % (ref 3.0–12.0)
Neutro Abs: 3.1 10*3/uL (ref 1.4–7.7)
Neutrophils Relative %: 53 % (ref 43.0–77.0)
Platelets: 341 10*3/uL (ref 150.0–400.0)
RBC: 4.71 Mil/uL (ref 3.87–5.11)
RDW: 13 % (ref 11.5–15.5)
WBC: 5.8 10*3/uL (ref 4.0–10.5)

## 2022-10-11 LAB — COMPREHENSIVE METABOLIC PANEL
ALT: 10 U/L (ref 0–35)
AST: 16 U/L (ref 0–37)
Albumin: 4.4 g/dL (ref 3.5–5.2)
Alkaline Phosphatase: 72 U/L (ref 39–117)
BUN: 7 mg/dL (ref 6–23)
CO2: 29 meq/L (ref 19–32)
Calcium: 9.6 mg/dL (ref 8.4–10.5)
Chloride: 105 meq/L (ref 96–112)
Creatinine, Ser: 0.69 mg/dL (ref 0.40–1.20)
GFR: 110.19 mL/min (ref >60.00)
Glucose, Bld: 76 mg/dL (ref 70–99)
Potassium: 3.8 meq/L (ref 3.5–5.1)
Sodium: 140 meq/L (ref 135–145)
Total Bilirubin: 0.7 mg/dL (ref 0.2–1.2)
Total Protein: 7.2 g/dL (ref 6.0–8.3)

## 2022-10-11 LAB — LIPASE: Lipase: 56 U/L (ref 11.0–59.0)

## 2022-10-11 NOTE — Progress Notes (Signed)
Subjective:    Patient ID: Valerie Kim, female    DOB: 1985/07/01, 37 y.o.   MRN: [id number]  Chief Complaint  Patient presents with   Throat Concern    Swelling Lt side of neck, x2weeks , Swelling has gone down for about 2-3 days    Headache    X3-4 weeks , have turned into Migraines within last x2weeks. Pt has taken over the counter meds to relieve pain    Abdominal Pain    Mid Abdomen pain, pt not sure how long, but it has been consistent , sharp pain    HPI Patient is in today for several concerns.   Left throat lump - seems to have resolved itself. Not sick recently. Has had ticks on her this summer, but didn't recall any actually latching or biting onto her.   Headaches - daily for weeks now, waking up with them. Occasionally migraine. Left sided posterior and superior. Advil helps, but it never fully resolves. She has hx of headaches and migraines, but never usually this long or consistent. Previously had acute relief medicine, but never worked, didn't have anything to try after that.  CT head 08/01/20 was negative.   Mid- epigastric pain - consistent daily, sharp, internal, sometimes radiates into esophagus. Two Prilosec at a time was not helping at all.   Stopped birth control last July & lost 30 lbs right away. Steadily losing since then.  Appetite has completely diminished the last 6 months.   Periods are still really heavy and painful  Some dizziness on standing. No vision changes. No gait changes. No numbness or weakness. Nausea earlier in the day, no vomiting. IBS type symptoms ongoing. No blood in the stool. Occasionally feels like medicine is getting stuck in throat.   Past Medical History:  Diagnosis Date   Annual physical exam 08/19/2020   Anxiety    Depression    Endometriosis    GERD (gastroesophageal reflux disease)    Migraines    Thyroid disease 2020   3mm nodule noted and monitored through [place]    Past Surgical History:  Procedure Laterality  Date   ABLATION     cardiac   HYSTEROSCOPY      Family History  Problem Relation Age of Onset   Hyperlipidemia Mother    Thyroid disease Mother    Depression Mother    Bipolar disorder Mother    Arthritis Mother    Miscarriages / [place] Mother    Varicose Veins Mother    Hyperlipidemia Father    Alcohol abuse Father    Depression Father    Bipolar disorder Father    Arthritis Father    Hypertension Father    Hypertension Paternal Grandmother    Heart disease Paternal Grandmother    Heart disease Paternal Grandfather    Hypertension Paternal Grandfather    ADD / ADHD Son    ADD / ADHD Brother    Hypertension Brother    Learning disabilities Brother    Anxiety disorder Daughter    Anxiety disorder Daughter     Social History   Tobacco Use   Smoking status: Former    Current packs/day: 0.00    Average packs/day: 1.5 packs/day for 15.0 years (22.5 ttl pk-yrs)    Types: Cigarettes, E-cigarettes    Start date: 02/07/2000    Quit date: 02/07/2015    Years since quitting: 7.6   Smokeless tobacco: Never  Vaping Use   Vaping status: Every Day  Substance Use Topics  Alcohol use: Yes    Comment: Occassional social 1-2 drinks   Drug use: Yes    Frequency: 7.0 times per week    Types: Marijuana, Psilocybin     No Known Allergies  Review of Systems NEGATIVE UNLESS OTHERWISE INDICATED IN HPI      Objective:     BP 111/77   Pulse 77   Temp (!) 97.5 F (36.4 C) (Temporal)   Ht 5\' 10"  (1.778 m)   Wt 171 lb 9.6 oz (77.8 kg)   SpO2 100%   BMI 24.62 kg/m   Wt Readings from Last 3 Encounters:  10/11/22 171 lb 9.6 oz (77.8 kg)  05/25/22 178 lb (80.7 kg)  05/23/22 181 lb 3.2 oz (82.2 kg)    BP Readings from Last 3 Encounters:  10/11/22 111/77  05/25/22 108/80  05/23/22 118/74     Physical Exam Vitals and nursing note reviewed.  Constitutional:      Appearance: Normal appearance. She is normal weight. She is not toxic-appearing.  HENT:     Head:  Normocephalic and atraumatic.     Right Ear: Tympanic membrane, ear canal and external ear normal.     Left Ear: Tympanic membrane, ear canal and external ear normal.     Nose: Nose normal.     Mouth/Throat:     Mouth: Mucous membranes are moist.  Eyes:     Extraocular Movements: Extraocular movements intact.     Conjunctiva/sclera: Conjunctivae normal.     Pupils: Pupils are equal, round, and reactive to light.  Cardiovascular:     Rate and Rhythm: Normal rate and regular rhythm.     Pulses: Normal pulses.     Heart sounds: Normal heart sounds.  Pulmonary:     Effort: Pulmonary effort is normal.     Breath sounds: Normal breath sounds.  Abdominal:     General: Abdomen is flat. Bowel sounds are normal.     Palpations: Abdomen is soft.     Tenderness: There is abdominal tenderness in the epigastric area. There is no right CVA tenderness, left CVA tenderness or guarding. Negative signs include psoas sign and obturator sign.  Musculoskeletal:        General: Normal range of motion.     Cervical back: Normal range of motion and neck supple.  Lymphadenopathy:     Head:     Right side of head: No submandibular or tonsillar adenopathy.     Left side of head: No submandibular or tonsillar adenopathy.     Cervical: No cervical adenopathy.     Right cervical: No superficial cervical adenopathy.    Left cervical: No superficial cervical adenopathy.     Upper Body:     Right upper body: No supraclavicular adenopathy.     Left upper body: No supraclavicular adenopathy.  Skin:    General: Skin is warm and dry.  Neurological:     General: No focal deficit present.     Mental Status: She is alert and oriented to person, place, and time.  Psychiatric:        Mood and Affect: Mood normal.        Behavior: Behavior normal.        Thought Content: Thought content normal.        Judgment: Judgment normal.        Assessment & Plan:  Epigastric abdominal pain -     CT ABDOMEN PELVIS W  CONTRAST; Future -     Comprehensive metabolic panel -  CBC with Differential/Platelet -     Lipase -     H. pylori breath test -     DG ESOPHAGUS W DOUBLE CM (HD); Future  Abnormal weight loss -     CT ABDOMEN PELVIS W CONTRAST; Future -     Comprehensive metabolic panel -     CBC with Differential/Platelet -     Lipase -     H. pylori breath test -     DG ESOPHAGUS W DOUBLE CM (HD); Future  Esophageal dysphagia -     H. pylori breath test -     DG ESOPHAGUS W DOUBLE CM (HD); Future  Frequent headaches Assessment & Plan: No red flag symptoms, just increase in frequency lately Possibly 2/2 stress and uncontrolled anxiety and depression Rest, fluids, try best to limit stress - track / monitor headaches / any triggers ? Consider medication therapy to help headaches    Endometriosis -     CT ABDOMEN PELVIS W CONTRAST; Future  Immunization due  Encounter for immunization -     Flu vaccine trivalent PF, 6mos and older(Flulaval,Afluria,Fluarix,Fluzone)  Anxiety Assessment & Plan:    10/11/2022   11:07 AM 05/23/2022    9:07 AM 04/19/2022   10:51 AM  PHQ9 SCORE ONLY  PHQ-9 Total Score 14 14 13       10/11/2022   11:07 AM 05/23/2022    9:08 AM 04/19/2022   10:52 AM 03/15/2022    4:56 PM  GAD 7 : Generalized Anxiety Score  Nervous, Anxious, on Edge 3 2 3    Control/stop worrying 2 3 3    Worry too much - different things 2 3 3    Trouble relaxing 2 2 2    Restless 2 2 2    Easily annoyed or irritable 3 3 3    Afraid - awful might happen 1 2 1    Total GAD 7 Score 15 17 17    Anxiety Difficulty Somewhat difficult Somewhat difficult Somewhat difficult      Information is confidential and restricted. Go to Review Flowsheets to unlock data.    Chronic, uncontrolled; she does not want to pursue prescribed treatment; may be contributing to other diagnoses discussed today and she's understanding of this. Denies SI or HI.          Return in about 6 weeks (around 11/22/2022) for  recheck/follow-up.  This note was prepared with assistance of Conservation officer, historic buildings. Occasional wrong-word or sound-a-like substitutions may have occurred due to the inherent limitations of voice recognition software.  Time Spent: 39 minutes of total time was spent on the date of the encounter performing the following actions: chart review prior to seeing the patient, obtaining history, performing a medically necessary exam, counseling on the treatment plan, placing orders, and documenting in our EHR.       Valerie Camberos M Kasondra Junod, PA-C

## 2022-10-11 NOTE — Patient Instructions (Signed)
Labs today  Imaging orders CT abd / pelvis and Barium swallow study  Strongly consider GI referral  Rest, fluids, try best to limit stress - track / monitor headaches / any triggers ? Consider medication therapy to help headaches   ER if any sudden severe change in symptoms

## 2022-10-12 ENCOUNTER — Encounter: Payer: Self-pay | Admitting: Obstetrics and Gynecology

## 2022-10-15 DIAGNOSIS — R1013 Epigastric pain: Secondary | ICD-10-CM | POA: Insufficient documentation

## 2022-10-15 DIAGNOSIS — R1319 Other dysphagia: Secondary | ICD-10-CM | POA: Insufficient documentation

## 2022-10-15 DIAGNOSIS — R519 Headache, unspecified: Secondary | ICD-10-CM | POA: Insufficient documentation

## 2022-10-15 NOTE — Assessment & Plan Note (Signed)
No red flag symptoms, just increase in frequency lately Possibly 2/2 stress and uncontrolled anxiety and depression Rest, fluids, try best to limit stress - track / monitor headaches / any triggers ? Consider medication therapy to help headaches

## 2022-10-15 NOTE — Assessment & Plan Note (Addendum)
    10/11/2022   11:07 AM 05/23/2022    9:07 AM 04/19/2022   10:51 AM  PHQ9 SCORE ONLY  PHQ-9 Total Score 14 14 13       10/11/2022   11:07 AM 05/23/2022    9:08 AM 04/19/2022   10:52 AM 03/15/2022    4:56 PM  GAD 7 : Generalized Anxiety Score  Nervous, Anxious, on Edge 3 2 3    Control/stop worrying 2 3 3    Worry too much - different things 2 3 3    Trouble relaxing 2 2 2    Restless 2 2 2    Easily annoyed or irritable 3 3 3    Afraid - awful might happen 1 2 1    Total GAD 7 Score 15 17 17    Anxiety Difficulty Somewhat difficult Somewhat difficult Somewhat difficult      Information is confidential and restricted. Go to Review Flowsheets to unlock data.    Chronic, uncontrolled; she does not want to pursue prescribed treatment; may be contributing to other diagnoses discussed today and she's understanding of this. Denies SI or HI.

## 2022-10-17 ENCOUNTER — Ambulatory Visit
Admission: RE | Admit: 2022-10-17 | Discharge: 2022-10-17 | Disposition: A | Discharge status: Home / Self Care | Payer: BC Managed Care – PPO | Source: Ambulatory Visit | Attending: Physician Assistant | Admitting: Physician Assistant

## 2022-10-17 DIAGNOSIS — R1013 Epigastric pain: Secondary | ICD-10-CM

## 2022-10-17 DIAGNOSIS — R1310 Dysphagia, unspecified: Secondary | ICD-10-CM | POA: Diagnosis not present

## 2022-10-17 DIAGNOSIS — R1319 Other dysphagia: Secondary | ICD-10-CM

## 2022-10-17 DIAGNOSIS — R634 Abnormal weight loss: Secondary | ICD-10-CM

## 2022-10-17 LAB — H. PYLORI BREATH TEST: H. pylori Breath Test: NOT DETECTED

## 2022-10-19 ENCOUNTER — Other Ambulatory Visit: Payer: BC Managed Care – PPO

## 2022-11-07 DIAGNOSIS — Z419 Encounter for procedure for purposes other than remedying health state, unspecified: Secondary | ICD-10-CM | POA: Diagnosis not present

## 2022-11-14 ENCOUNTER — Ambulatory Visit
Admission: RE | Admit: 2022-11-14 | Discharge: 2022-11-14 | Disposition: A | Discharge status: Home / Self Care | Payer: BC Managed Care – PPO | Source: Ambulatory Visit | Attending: Physician Assistant

## 2022-11-14 DIAGNOSIS — R634 Abnormal weight loss: Secondary | ICD-10-CM

## 2022-11-14 DIAGNOSIS — R1013 Epigastric pain: Secondary | ICD-10-CM | POA: Diagnosis not present

## 2022-11-14 DIAGNOSIS — K429 Umbilical hernia without obstruction or gangrene: Secondary | ICD-10-CM | POA: Diagnosis not present

## 2022-11-14 DIAGNOSIS — N809 Endometriosis, unspecified: Secondary | ICD-10-CM

## 2022-11-14 MED ORDER — IOPAMIDOL (ISOVUE-370) INJECTION 76%
500.0000 mL | Freq: Once | INTRAVENOUS | Status: AC | PRN
  Administered 2022-11-14: 80 mL via INTRAVENOUS

## 2022-11-22 ENCOUNTER — Encounter: Payer: Self-pay | Admitting: Physician Assistant

## 2022-11-22 ENCOUNTER — Ambulatory Visit: Payer: BC Managed Care – PPO | Admitting: Physician Assistant

## 2022-11-22 VITALS — BP 102/78 | HR 79 | Temp 97.3°F | Ht 70.0 in | Wt 170.6 lb

## 2022-11-22 DIAGNOSIS — I459 Conduction disorder, unspecified: Secondary | ICD-10-CM | POA: Insufficient documentation

## 2022-11-22 DIAGNOSIS — R634 Abnormal weight loss: Secondary | ICD-10-CM

## 2022-11-22 DIAGNOSIS — M419 Scoliosis, unspecified: Secondary | ICD-10-CM | POA: Insufficient documentation

## 2022-11-22 DIAGNOSIS — R1013 Epigastric pain: Secondary | ICD-10-CM

## 2022-11-22 DIAGNOSIS — R1319 Other dysphagia: Secondary | ICD-10-CM

## 2022-11-22 DIAGNOSIS — F419 Anxiety disorder, unspecified: Secondary | ICD-10-CM | POA: Diagnosis not present

## 2022-11-22 DIAGNOSIS — J209 Acute bronchitis, unspecified: Secondary | ICD-10-CM | POA: Insufficient documentation

## 2022-11-22 DIAGNOSIS — F988 Other specified behavioral and emotional disorders with onset usually occurring in childhood and adolescence: Secondary | ICD-10-CM | POA: Insufficient documentation

## 2022-11-22 DIAGNOSIS — J00 Acute nasopharyngitis [common cold]: Secondary | ICD-10-CM | POA: Insufficient documentation

## 2022-11-22 DIAGNOSIS — R252 Cramp and spasm: Secondary | ICD-10-CM | POA: Insufficient documentation

## 2022-11-22 DIAGNOSIS — R059 Cough, unspecified: Secondary | ICD-10-CM | POA: Insufficient documentation

## 2022-11-22 DIAGNOSIS — O289 Unspecified abnormal findings on antenatal screening of mother: Secondary | ICD-10-CM | POA: Insufficient documentation

## 2022-11-22 DIAGNOSIS — F439 Reaction to severe stress, unspecified: Secondary | ICD-10-CM

## 2022-11-22 DIAGNOSIS — T7840XA Allergy, unspecified, initial encounter: Secondary | ICD-10-CM | POA: Insufficient documentation

## 2022-11-22 DIAGNOSIS — J309 Allergic rhinitis, unspecified: Secondary | ICD-10-CM | POA: Insufficient documentation

## 2022-11-22 DIAGNOSIS — F4321 Adjustment disorder with depressed mood: Secondary | ICD-10-CM | POA: Insufficient documentation

## 2022-11-22 HISTORY — DX: Allergy, unspecified, initial encounter: T78.40XA

## 2022-11-22 HISTORY — DX: Acute nasopharyngitis (common cold): J00

## 2022-11-22 MED ORDER — DEXLANSOPRAZOLE 60 MG PO CPDR
60.0000 mg | DELAYED_RELEASE_CAPSULE | Freq: Every day | ORAL | 0 refills | Status: DC
Start: 2022-11-22 — End: 2022-11-24

## 2022-11-22 NOTE — Progress Notes (Signed)
Subjective:    Patient ID: Valerie Kim, female    DOB: 07-22-1985, 37 y.o.   MRN: [id number]  Chief Complaint  Patient presents with   Medical Management of Chronic Issues    Pt in office for 6 week f/u from epigastric pain and abnormal weight loss. All labs and imaging normal; discuss GI referral and pt is agreeable if this is what is needed; pt states symptoms not really any better    HPI Patient is in today for recheck from previous visit.   Discussed the use of AI scribe software for clinical note transcription with the patient, who gave verbal consent to proceed.  History of Present Illness   The patient, with a history of chronic pain and high anxiety, presents with chronic upper abdominal pain. The pain is described as a constant squeezing sensation, located in the upper abdomen. The pain is not related to eating, bowel movements, or specific foods. The patient has tried over-the-counter acid blockers without relief. The patient also reports chronic pain, headaches, muscle aches, high anxiety, and sleep disturbances. The patient suspects perimenopause due to heavy, short periods and physical changes such as hair loss. The patient also mentions potential interest in seeking an autism diagnosis.       Past Medical History:  Diagnosis Date   Allergies 11/22/2022   Annual physical exam 08/19/2020   Anxiety    Common cold 11/22/2022   increase po fluids, rest, tylenol prn, robitussin prn cough     Depression    Endometriosis    GERD (gastroesophageal reflux disease)    Migraines    Thyroid disease 2020   3mm nodule noted and monitored through [place]    Past Surgical History:  Procedure Laterality Date   ABLATION     cardiac   HYSTEROSCOPY      Family History  Problem Relation Age of Onset   Hyperlipidemia Mother    Thyroid disease Mother    Depression Mother    Bipolar disorder Mother    Arthritis Mother    Miscarriages / [place] Mother    Varicose Veins  Mother    Hyperlipidemia Father    Alcohol abuse Father    Depression Father    Bipolar disorder Father    Arthritis Father    Hypertension Father    Hypertension Paternal Grandmother    Heart disease Paternal Grandmother    Heart disease Paternal Grandfather    Hypertension Paternal Grandfather    ADD / ADHD Son    ADD / ADHD Brother    Hypertension Brother    Learning disabilities Brother    Anxiety disorder Daughter    Anxiety disorder Daughter     Social History   Tobacco Use   Smoking status: Former    Current packs/day: 0.00    Average packs/day: 1.5 packs/day for 15.0 years (22.5 ttl pk-yrs)    Types: Cigarettes, E-cigarettes    Start date: 02/07/2000    Quit date: 02/07/2015    Years since quitting: 7.7   Smokeless tobacco: Never  Vaping Use   Vaping status: Every Day  Substance Use Topics   Alcohol use: Yes    Comment: Occassional social 1-2 drinks   Drug use: Yes    Frequency: 7.0 times per week    Types: Marijuana, Psilocybin     No Known Allergies  Review of Systems NEGATIVE UNLESS OTHERWISE INDICATED IN HPI      Objective:     BP 102/78 (BP Location: Left Arm)  Pulse 79   Temp (!) 97.3 F (36.3 C) (Temporal)   Ht 5\' 10"  (1.778 m)   Wt 170 lb 9.6 oz (77.4 kg)   SpO2 98%   BMI 24.48 kg/m   Wt Readings from Last 3 Encounters:  11/22/22 170 lb 9.6 oz (77.4 kg)  10/11/22 171 lb 9.6 oz (77.8 kg)  05/25/22 178 lb (80.7 kg)    BP Readings from Last 3 Encounters:  11/22/22 102/78  10/11/22 111/77  05/25/22 108/80     Physical Exam Vitals and nursing note reviewed.  Constitutional:      Appearance: Normal appearance.  Cardiovascular:     Rate and Rhythm: Normal rate and regular rhythm.     Pulses: Normal pulses.     Heart sounds: No murmur heard. Pulmonary:     Effort: Pulmonary effort is normal.     Breath sounds: Normal breath sounds.  Abdominal:     General: Abdomen is flat.     Palpations: Abdomen is soft.  Neurological:      General: No focal deficit present.     Mental Status: She is alert and oriented to person, place, and time.  Psychiatric:        Mood and Affect: Mood normal.        Behavior: Behavior normal.        Assessment & Plan:  Epigastric abdominal pain -     Dexlansoprazole; Take 1 capsule (60 mg total) by mouth daily.  Dispense: 30 capsule; Refill: 0 -     Ambulatory referral to Gastroenterology  Anxiety -     Ambulatory referral to Psychology  Stress at home -     Ambulatory referral to Psychology  Abnormal weight loss -     Ambulatory referral to Gastroenterology  Esophageal dysphagia -     Ambulatory referral to Gastroenterology       Upper Abdominal Pain Chronic, squeezing pain, not related to meals or bowel movements. Previous use of Protonix and over-the-counter Prilosec with limited relief. Concern for possible Barrett's esophagus due to chronicity and severity of symptoms. -Start Dexilant, take 30 minutes prior to a meal on an empty stomach for 30 days. -Urgent referral to GI for endoscopy. -CT abd / pelvis negative. Labs recently negative. Swallow study normal.  -Needs to stop marijuana use.    Chronic Pain and Anxiety Reports chronic pain since childhood, frequent headaches, muscle aches, and high anxiety. Considering seeking an autism diagnosis. -Referral to Dr. Monna Fam for psychological counseling. -Consider consultation with [facility] for a functional medicine approach.  Follow-up in the new year, sooner if any significant changes or concerns arise.        Return in about 3 months (around 02/22/2023) for recheck/follow-up.   Valerie Placide M Zenon Leaf, PA-C

## 2022-11-23 ENCOUNTER — Telehealth: Payer: Self-pay

## 2022-11-23 DIAGNOSIS — R1013 Epigastric pain: Secondary | ICD-10-CM

## 2022-11-23 NOTE — Telephone Encounter (Signed)
Pharmacy Patient Advocate Encounter   Received notification from Physician's Office that prior authorization for Dexlansoprazole is required/requested.   Insurance verification completed.   The patient is insured through  [employer]  .   Per test claim: P/A is not needed at this time and must only get Generic form. Per test calaim the cost is around/about  10.00$

## 2022-11-24 ENCOUNTER — Encounter: Payer: Self-pay | Admitting: Gastroenterology

## 2022-11-24 ENCOUNTER — Other Ambulatory Visit (HOSPITAL_COMMUNITY): Payer: Self-pay

## 2022-11-24 MED ORDER — DEXLANSOPRAZOLE 60 MG PO CPDR
60.0000 mg | DELAYED_RELEASE_CAPSULE | Freq: Every day | ORAL | 0 refills | Status: DC
  Filled 2022-11-24: qty 30, 30d supply, fill #0

## 2022-11-24 NOTE — Telephone Encounter (Signed)
Spoke to pt told her PA for Dexilant was not needed as long as generic was ordered. Told her [facility] for some reason can not get Rx to go through on your insurance, even though it is generic. Pt verbalized understanding. Asked her if she would mind if I sent Rx to a [facility] pharmacy to see if they can get it to go through for her? Pt said that would be fine. Clarified which pharmacy was close to her, [facility] [address] [facility] [address]. Told her I will send Rx there and see what happens. Pt verbalized understanding. Rx sent.

## 2022-11-24 NOTE — Addendum Note (Signed)
Addended by: Jimmye Norman on: 11/24/2022 11:53 AM   Modules accepted: Orders

## 2022-11-24 NOTE — Telephone Encounter (Signed)
Called [facility] pharmacy and spoke to [facility] told her I just sent over a Rx for Dexilant for pt. PA is not needed according to insurance as long as Generic was ordered, but [facility] could not get it to go through I wanted to see if you could. Courtney verbalized understanding and put me on hold came back and said had to run under her [facility] insurance Brand Name and came back at $ 4.00. Told her okay, thank you I will let pt know.

## 2022-11-24 NOTE — Telephone Encounter (Signed)
[facility] pharmacy and spoke to [place], told her PA for Dexilant is not needed but must only get generic form and that is what is ordered. Toni Amend said she has tried to run it with all their NDC numbers and it will not go through. Told her okay.

## 2022-11-24 NOTE — Telephone Encounter (Signed)
Spoke to pt told her Rx sent to [facility] and they were able to run it through your [facility] insurance came back at $ 4.00. They are getting Rx ready for you. Pt verbalized understanding.

## 2022-11-28 ENCOUNTER — Encounter: Payer: Self-pay | Admitting: Gastroenterology

## 2022-11-28 ENCOUNTER — Ambulatory Visit (INDEPENDENT_AMBULATORY_CARE_PROVIDER_SITE_OTHER): Payer: BC Managed Care – PPO | Admitting: Gastroenterology

## 2022-11-28 VITALS — BP 110/70 | HR 74 | Ht 70.0 in | Wt 170.0 lb

## 2022-11-28 DIAGNOSIS — K582 Mixed irritable bowel syndrome: Secondary | ICD-10-CM

## 2022-11-28 DIAGNOSIS — R634 Abnormal weight loss: Secondary | ICD-10-CM | POA: Diagnosis not present

## 2022-11-28 DIAGNOSIS — R1013 Epigastric pain: Secondary | ICD-10-CM | POA: Diagnosis not present

## 2022-11-28 DIAGNOSIS — R194 Change in bowel habit: Secondary | ICD-10-CM

## 2022-11-28 DIAGNOSIS — K219 Gastro-esophageal reflux disease without esophagitis: Secondary | ICD-10-CM

## 2022-11-28 NOTE — Progress Notes (Unsigned)
HPI : Valerie Kim is a 37 y.o. female with a history of anxiety, depression, migraines and endometriosis who is referred to [place] by Allwardt, Crist Infante, PA-C for further evaluation and management of abdominal pain/GERD symptoms.  The patient states that she started having GERD symptoms with her third pregnancy.  She took Protonix for 3 to 4 years, but was advised to stop taking it by her primary doctor.  Since then, she has had recurrence of her GERD symptoms but she has been "dealing with it". Over the past several months, she has had upper GI symptoms that seem a little more severe and different than her typical GERD symptoms.  She describes a constant pain in her upper abdomen/lower chest.  She has difficulty describing it, but says sometimes it has a burning quality.  At other times it feels like someone is "reaching into her and pulling on her insides."  She reports having discomfort every day, and it never seems to completely go away.  The pain is worsened with either eating or drinking.  She does have symptoms that awaken her from sleep on occasion. In addition to the pain, she does have symptoms of acid regurgitation and throat irritation.  She has frequent nausea, but very rare vomiting. Sometimes she feels like food does get stuck in her chest when she swallows, but she denies any episodes of having to forcefully bring up stuck food. She tried taking over-the-counter omeprazole for about a month but did not notice any improvement.  She was recently prescribed Dexilant, but has not yet started it.  The patient reports losing 60 pounds in the past year.  She attributes this to decreased appetite and avoidance of eating due to her numerous GI symptoms. Review of her weights in EMR indicates that she currently weighs 170 pounds which is stable from 6 weeks ago.  In March and April of this year she weighed around 180 pounds.  September 2023 she weighed 196 in June 2022 she weighed 217.  She had  a CT of the abdomen and pelvis and barium swallow recently which were unremarkable.  An H. pylori breath test was also unremarkable.  She reports a long standing history of irregular bowel habits, alternating between constipation and diarrhea, with diarrhea being her most common bowel habit.  She has 3-4 bowel movements most days with stools that are typically loose/watery in consistency with chunks.  Commonly has urgency but no incontinence.  Sometimes she will have bowel movements after going to bed, but rarely is awakened from sleep with the urge to defecate.  Her constipation is benefits by going a few days without a bowel movement, followed by hard stools with straining.  She denies any blood in the stool. She has never tried any fiber supplements.  She denies any family history of celiac disease, inflammatory bowel disease or GI malignancy. She was previously tested for celiac disease in 2021 with negative serology panel.   She has never had an EGD or colonoscopy.  She admits that she has a lot of ongoing issues with anxiety and depression and has a therapy appointment in December.  She has been followed by behavioral health off and on for many years, but it has been several years since she has seen anyone    CT Abdomen/Pelvis Nov 18, 2022 IMPRESSION: 1. No acute abnormality in the abdomen or pelvis. 2. Tiny fat containing paraumbilical hernia.   Barium Swallow Sept 2024 IMPRESSION: No abnormality seen in the esophagus.  Component Ref Range & Units 1 mo ago  H. pylori Breath Test NOT DETECTED NOT DETECTED    Past Medical History:  Diagnosis Date   Allergies 11/22/2022   Annual physical exam 08/19/2020   Anxiety    Common cold 11/22/2022   increase po fluids, rest, tylenol prn, robitussin prn cough     Depression    Endometriosis    GERD (gastroesophageal reflux disease)    Migraines    Thyroid disease 2020   3mm nodule noted and monitored through [place]   Supraventricular tachycardia status post ablation PTSD  Past Surgical History:  Procedure Laterality Date   ABLATION     cardiac   HYSTEROSCOPY     Family History  Problem Relation Age of Onset   Hyperlipidemia Mother    Thyroid disease Mother    Depression Mother    Bipolar disorder Mother    Arthritis Mother    Miscarriages / [place] Mother    Varicose Veins Mother    Hyperlipidemia Father    Alcohol abuse Father    Depression Father    Bipolar disorder Father    Arthritis Father    Hypertension Father    Hypertension Paternal Grandmother    Heart disease Paternal Grandmother    Heart disease Paternal Grandfather    Hypertension Paternal Grandfather    ADD / ADHD Son    ADD / ADHD Brother    Hypertension Brother    Learning disabilities Brother    Anxiety disorder Daughter    Anxiety disorder Daughter    Social History   Tobacco Use   Smoking status: Former    Current packs/day: 0.00    Average packs/day: 1.5 packs/day for 15.0 years (22.5 ttl pk-yrs)    Types: Cigarettes, E-cigarettes    Start date: 02/07/2000    Quit date: 02/07/2015    Years since quitting: 7.8   Smokeless tobacco: Never  Vaping Use   Vaping status: Every Day  Substance Use Topics   Alcohol use: Yes    Comment: Occassional social 1-2 drinks   Drug use: Yes    Frequency: 7.0 times per week    Types: Marijuana, Psilocybin   Current Outpatient Medications  Medication Sig Dispense Refill   dexlansoprazole (DEXILANT) 60 MG capsule Take 1 capsule (60 mg total) by mouth daily. 30 capsule 0   No current facility-administered medications for this visit.   No Known Allergies   Review of Systems: All systems reviewed and negative except where noted in HPI.    CT ABDOMEN PELVIS W CONTRAST  Result Date: 11/18/2022 CLINICAL DATA:  Epigastric abdominal pain, abdominal weight loss history of endometriosis. EXAM: CT ABDOMEN AND PELVIS WITH CONTRAST TECHNIQUE: Multidetector CT imaging of  the abdomen and pelvis was performed using the standard protocol following bolus administration of intravenous contrast. RADIATION DOSE REDUCTION: This exam was performed according to the departmental dose-optimization program which includes automated exposure control, adjustment of the mA and/or kV according to patient size and/or use of iterative reconstruction technique. CONTRAST:  80mL ISOVUE-370 IOPAMIDOL (ISOVUE-370) INJECTION 76% COMPARISON:  Ultrasound August 27, 2020 FINDINGS: Lower chest: No acute abnormality. Hepatobiliary: No suspicious hepatic lesion. Pharyngeal cap on the gallbladder. No biliary ductal dilation. Pancreas: No pancreatic ductal dilation or evidence of acute inflammation. Spleen: No splenomegaly. Adrenals/Urinary Tract: Bilateral adrenal glands appear normal. No hydronephrosis. Urinary bladder is unremarkable for degree of distension. Stomach/Bowel: Radiopaque enteric contrast material traverses the rectum. Stomach is unremarkable for degree of distension. No pathologic dilation of  small or large bowel. Normal appendix. No evidence of acute bowel inflammation Vascular/Lymphatic: Normal caliber abdominal aorta. Smooth IVC contours. The portal, splenic and superior mesenteric veins are patent. No pathologically enlarged abdominal or pelvic lymph nodes. Reproductive: Pessary or contraceptive device in the vagina. Heterogeneous appearance of the uterus is within normal limits depending on timing of patient's menstrual cycle. Other: Trace pelvic free fluid is within physiologic normal limits. Tiny fat containing paraumbilical hernia. Musculoskeletal: No acute osseous abnormality. IMPRESSION: 1. No acute abnormality in the abdomen or pelvis. 2. Tiny fat containing paraumbilical hernia. Electronically Signed   By: Maudry Mayhew M.D.   On: 11/18/2022 12:19    Physical Exam: BP 110/70   Pulse 74   Ht 5\' 10"  (1.778 m)   Wt 170 lb (77.1 kg)   BMI 24.39 kg/m  Constitutional:  Pleasant,well-developed, Caucasian female in no acute distress. HEENT: Normocephalic and atraumatic. Conjunctivae are normal. No scleral icterus. Neck supple.  Cardiovascular: Normal rate, regular rhythm.  Pulmonary/chest: Effort normal and breath sounds normal. No wheezing, rales or rhonchi. Abdominal: Soft, nondistended, nontender. Bowel sounds active throughout. There are no masses palpable. No hepatomegaly. Extremities: no edema Neurological: Alert and oriented to person place and time. Skin: Skin is warm and dry. No rashes noted. Psychiatric: Normal mood and affect. Behavior is normal.  CBC    Component Value Date/Time   WBC 5.8 10/11/2022 1154   RBC 4.71 10/11/2022 1154   HGB 14.0 10/11/2022 1154   HGB 15.0 08/19/2020 1258   HCT 41.9 10/11/2022 1154   HCT 45.4 08/19/2020 1258   PLT 341.0 10/11/2022 1154   PLT 336 08/19/2020 1258   MCV 88.8 10/11/2022 1154   MCV 90 08/19/2020 1258   MCH 29.9 08/19/2020 1258   MCHC 33.5 10/11/2022 1154   RDW 13.0 10/11/2022 1154   RDW 12.3 08/19/2020 1258   LYMPHSABS 1.9 10/11/2022 1154   LYMPHSABS 1.7 08/19/2020 1258   MONOABS 0.7 10/11/2022 1154   EOSABS 0.1 10/11/2022 1154   EOSABS 0.2 08/19/2020 1258   BASOSABS 0.0 10/11/2022 1154   BASOSABS 0.0 08/19/2020 1258    CMP     Component Value Date/Time   NA 140 10/11/2022 1154   NA 139 08/19/2020 1258   K 3.8 10/11/2022 1154   CL 105 10/11/2022 1154   CO2 29 10/11/2022 1154   GLUCOSE 76 10/11/2022 1154   BUN 7 10/11/2022 1154   BUN 9 08/19/2020 1258   CREATININE 0.69 10/11/2022 1154   CALCIUM 9.6 10/11/2022 1154   PROT 7.2 10/11/2022 1154   PROT 7.1 08/19/2020 1258   ALBUMIN 4.4 10/11/2022 1154   ALBUMIN 5.0 (H) 08/19/2020 1258   AST 16 10/11/2022 1154   ALT 10 10/11/2022 1154   ALKPHOS 72 10/11/2022 1154   BILITOT 0.7 10/11/2022 1154   BILITOT <0.2 08/19/2020 1258       Latest Ref Rng & Units 10/11/2022   11:54 AM 05/25/2022   11:38 AM 09/05/2021    9:50 AM  CBC  EXTENDED  WBC 4.0 - 10.5 K/uL 5.8  9.8  4.7   RBC 3.87 - 5.11 Mil/uL 4.71  4.44  4.64   Hemoglobin 12.0 - 15.0 g/dL 16.1  09.6  04.5   HCT 36.0 - 46.0 % 41.9  39.4  40.8   Platelets 150.0 - 400.0 K/uL 341.0  373.0  281.0   NEUT# 1.4 - 7.7 K/uL 3.1  7.5  2.5   Lymph# 0.7 - 4.0 K/uL 1.9  1.6  1.5  ASSESSMENT AND PLAN: 37 year old female with longstanding history of GERD as well as irregular bowel habits alternating between constipation and diarrhea, with recent worsening of GERD like symptoms manifested as buring quality upper abdominal/chest pain.  She has also had significant unintentional weight loss, although weight loss has stabilized recently. I am suspicious for an underlying gut brain axis disorder given her significant problems with anxiety depression and her longstanding GI symptoms.  However, given her worsening upper GI symptoms and significant weight loss, an EGD is reasonable.  We will assess for complications of GERD, significant peptic ulcer disease/stricture and malignancy.  Although H. pylori breath test negative, we will also plan for gastric biopsies to rule out H. pylori.  Patient to start Dexilant as recommended by PCP Will plan to follow-up in 2 to 3 months, and if not improving, consider colonoscopy.  In the meantime, I recommend she start taking Metamucil on a daily basis to improve her stool bulk and consistency.   GERD/epigastric pain -Start Dexilant -EGD  Unintentional weight loss - EGD - Establish care with behavioral health  Alternating constipation and diarrhea - Start Metamucil daily -Follow-up in 3 months.  Consider colonoscopy if weight not improving  Biruk Troia E. Tomasa Rand, MD [facility]  I spent a total of 45 minutes reviewing the patient's medical record, interviewing and examining the patient, discussing her diagnosis and management of her condition going forward, and documenting in the medical record   Allwardt, Crist Infante,  PA-C

## 2022-11-28 NOTE — Patient Instructions (Addendum)
_______________________________________________________  If your blood pressure at your visit was 140/90 or greater, please contact your primary care physician to follow up on this.  _______________________________________________________  If you are age 38 or older, your body mass index should be between 23-30. Your Body mass index is 24.39 kg/m. If this is out of the aforementioned range listed, please consider follow up with your Primary Care Provider.  If you are age 84 or younger, your body mass index should be between 19-25. Your Body mass index is 24.39 kg/m. If this is out of the aformentioned range listed, please consider follow up with your Primary Care Provider.   ________________________________________________________  The [place] [facility] providers would like to encourage you to use [facility] to communicate with providers for non-urgent requests or questions.  Due to long hold times on the telephone, sending your provider a message by [facility] may be a faster and more efficient way to get a response.  Please allow 48 business hours for a response.  Please remember that this is for non-urgent requests.  _______________________________________________________  Please purchase the following medications over the counter and take as directed:  START: Metamucil daily.  You have been scheduled for an endoscopy. Please follow written instructions given to you at your visit today.  If you use inhalers (even only as needed), please bring them with you on the day of your procedure.  If you take any of the following medications, they will need to be adjusted prior to your procedure:   DO NOT TAKE 7 DAYS PRIOR TO TEST- Trulicity (dulaglutide) Ozempic, Wegovy (semaglutide) Mounjaro (tirzepatide) Bydureon Bcise (exanatide extended release)  DO NOT TAKE 1 DAY PRIOR TO YOUR TEST Rybelsus (semaglutide) Adlyxin (lixisenatide) Victoza (liraglutide) Byetta  (exanatide) ___________________________________________________________________________   Due to recent changes in healthcare laws, you may see the results of your imaging and laboratory studies on MyChart before your provider has had a chance to review them.  We understand that in some cases there may be results that are confusing or concerning to you. Not all laboratory results come back in the same time frame and the provider may be waiting for multiple results in order to interpret others.  Please give [place] 48 hours in order for your provider to thoroughly review all the results before contacting the office for clarification of your results.   Thank you for entrusting me with your care and choosing [facility].  Dr Tomasa Rand

## 2022-12-08 DIAGNOSIS — Z419 Encounter for procedure for purposes other than remedying health state, unspecified: Secondary | ICD-10-CM | POA: Diagnosis not present

## 2022-12-12 ENCOUNTER — Ambulatory Visit: Payer: BC Managed Care – PPO | Admitting: Gastroenterology

## 2022-12-12 ENCOUNTER — Encounter: Payer: Self-pay | Admitting: Gastroenterology

## 2022-12-12 VITALS — BP 117/81 | HR 52 | Temp 98.2°F | Resp 17 | Ht 70.0 in | Wt 170.0 lb

## 2022-12-12 DIAGNOSIS — K3189 Other diseases of stomach and duodenum: Secondary | ICD-10-CM | POA: Diagnosis not present

## 2022-12-12 DIAGNOSIS — R634 Abnormal weight loss: Secondary | ICD-10-CM

## 2022-12-12 DIAGNOSIS — K319 Disease of stomach and duodenum, unspecified: Secondary | ICD-10-CM | POA: Diagnosis not present

## 2022-12-12 DIAGNOSIS — R109 Unspecified abdominal pain: Secondary | ICD-10-CM

## 2022-12-12 DIAGNOSIS — R635 Abnormal weight gain: Secondary | ICD-10-CM | POA: Diagnosis not present

## 2022-12-12 MED ORDER — SODIUM CHLORIDE 0.9 % IV SOLN: 500.0000 mL | INTRAVENOUS | Status: DC

## 2022-12-12 NOTE — Patient Instructions (Signed)
  Resume previous diet  Continue present medications  Awaiting pathology results  YOU HAD AN ENDOSCOPIC PROCEDURE TODAY AT THE [facility]:   Refer to the procedure report that was given to you for any specific questions about what was found during the examination.  If the procedure report does not answer your questions, please call your gastroenterologist to clarify.  If you requested that your care partner not be given the details of your procedure findings, then the procedure report has been included in a sealed envelope for you to review at your convenience later.  YOU SHOULD EXPECT: Some feelings of bloating in the abdomen. Passage of more gas than usual.  Walking can help get rid of the air that was put into your GI tract during the procedure and reduce the bloating. If you had a lower endoscopy (such as a colonoscopy or flexible sigmoidoscopy) you may notice spotting of blood in your stool or on the toilet paper. If you underwent a bowel prep for your procedure, you may not have a normal bowel movement for a few days.  Please Note:  You might notice some irritation and congestion in your nose or some drainage.  This is from the oxygen used during your procedure.  There is no need for concern and it should clear up in a day or so.  SYMPTOMS TO REPORT IMMEDIATELY:  Educational handout provided to patient related to Hemorrhoids, Polyps, and Diverticulosis  Resume previous diet  Continue present medications  Awaiting pathology results Following upper endoscopy (EGD)  Vomiting of blood or coffee ground material  New chest pain or pain under the shoulder blades  Painful or persistently difficult swallowing  New shortness of breath  Fever of 100F or higher  Black, tarry-looking stools  For urgent or emergent issues, a gastroenterologist can be reached at any hour by calling [phone]. Do not use MyChart messaging for urgent concerns.    DIET:  We do recommend a  small meal at first, but then you may proceed to your regular diet.  Drink plenty of fluids but you should avoid alcoholic beverages for 24 hours.  ACTIVITY:  You should plan to take it easy for the rest of today and you should NOT DRIVE or use heavy machinery until tomorrow (because of the sedation medicines used during the test).    FOLLOW UP: Our staff will call the number listed on your records the next business day following your procedure.  We will call around 7:15- 8:00 am to check on you and address any questions or concerns that you may have regarding the information given to you following your procedure. If we do not reach you, we will leave a message.     If any biopsies were taken you will be contacted by phone or by letter within the next 1-3 weeks.  Please call us at [phone] if you have not heard about the biopsies in 3 weeks.    SIGNATURES/CONFIDENTIALITY: You and/or your care partner have signed paperwork which will be entered into your electronic medical record.  These signatures attest to the fact that that the information above on your After Visit Summary has been reviewed and is understood.  Full responsibility of the confidentiality of this discharge information lies with you and/or your care-partner.

## 2022-12-12 NOTE — Op Note (Signed)
[facility] Patient Name: Valerie Kim Procedure Date: 12/12/2022 10:21 AM MRN: [id number] Endoscopist: Lorin Picket E. Tomasa Rand , MD, [id number] Age: 37 Referring MD:  Date of Birth: 1985/09/11 Gender: Female Account #: [id number] Procedure:                Upper GI endoscopy Indications:              Abdominal pain, Weight loss Medicines:                Monitored Anesthesia Care Procedure:                Pre-Anesthesia Assessment:                           - Prior to the procedure, a History and Physical                            was performed, and patient medications and                            allergies were reviewed. The patient's tolerance of                            previous anesthesia was also reviewed. The risks                            and benefits of the procedure and the sedation                            options and risks were discussed with the patient.                            All questions were answered, and informed consent                            was obtained. Prior Anticoagulants: The patient has                            taken no anticoagulant or antiplatelet agents. ASA                            Grade Assessment: II - A patient with mild systemic                            disease. After reviewing the risks and benefits,                            the patient was deemed in satisfactory condition to                            undergo the procedure.                           After obtaining informed consent, the endoscope was  passed under direct vision. Throughout the                            procedure, the patient's blood pressure, pulse, and                            oxygen saturations were monitored continuously. The                            Olympus Scope [id number] was introduced through the                            mouth, and advanced to the third part of duodenum.                            The upper GI  endoscopy was accomplished without                            difficulty. The patient tolerated the procedure                            well. Scope In: Scope Out: Findings:                 The examined portions of the nasopharynx,                            oropharynx and larynx were normal.                           The examined esophagus was normal.                           The entire examined stomach was normal. Biopsies                            were taken with a cold forceps for Helicobacter                            pylori testing. Estimated blood loss was minimal.                           The examined duodenum was normal. Biopsies for                            histology were taken with a cold forceps for                            evaluation of celiac disease. Estimated blood loss                            was minimal. Complications:            No immediate complications. Estimated Blood Loss:     Estimated blood loss was minimal. Impression:               -  The examined portions of the nasopharynx,                            oropharynx and larynx were normal.                           - Normal esophagus.                           - Normal stomach. Biopsied.                           - Normal examined duodenum. Biopsied.                           - No endoscopic abnormalities to explain patient's                            symptoms. Recommendation:           - Patient has a contact number available for                            emergencies. The signs and symptoms of potential                            delayed complications were discussed with the                            patient. Return to normal activities tomorrow.                            Written discharge instructions were provided to the                            patient.                           - Resume previous diet.                           - Continue present medications.                           -  Await pathology results.                           - Follow up in the office as needed for further                            management of chronic GI symptoms. Zerenity Bowron E. Tomasa Rand, MD 12/12/2022 10:40:58 AM This report has been signed electronically.

## 2022-12-12 NOTE — Progress Notes (Signed)
Pt's states no medical or surgical changes since previsit or office visit. 

## 2022-12-12 NOTE — Progress Notes (Signed)
Sedate, gd SR, tolerated procedure well, VSS, report to RN 

## 2022-12-12 NOTE — Progress Notes (Signed)
Called to room to assist during endoscopic procedure.  Patient ID and intended procedure confirmed with present staff. Received instructions for my participation in the procedure from the performing physician.  

## 2022-12-12 NOTE — Progress Notes (Signed)
History and Physical Interval Note:  12/12/2022 10:19 AM  Valerie Kim  has presented today for endoscopic procedure(s), with the diagnosis of  Encounter Diagnosis  Name Primary?   Loss of weight Yes  .  The various methods of evaluation and treatment have been discussed with the patient and/or family. After consideration of risks, benefits and other options for treatment, the patient has consented to  the endoscopic procedure(s).   The patient's history has been reviewed, patient examined, no change in status, stable for endoscopic procedure(s).  I have reviewed the patient's chart and labs.  Questions were answered to the patient's satisfaction.     [name] E. Tomasa Rand, MD [facility]

## 2022-12-13 ENCOUNTER — Telehealth: Payer: Self-pay

## 2022-12-13 NOTE — Telephone Encounter (Signed)
  Follow up Call-     12/12/2022    9:35 AM  Call back number  Post procedure Call Back phone  # [phone]  Permission to leave phone message Yes     Patient questions:  Do you have a fever, pain , or abdominal swelling? No. Pain Score  0 *  Have you tolerated food without any problems? Yes.    Have you been able to return to your normal activities? Yes.    Do you have any questions about your discharge instructions: Diet   No. Medications  No. Follow up visit  No.  Do you have questions or concerns about your Care? No.  Actions: * If pain score is 4 or above: No action needed, pain <4.

## 2022-12-14 IMAGING — CT CT HEAD W/O CM
4 series · 17 of 47 positions shown, 19 images · non-contrast
Comparison: None.

CLINICAL DATA: Headache and nausea.

EXAM:
CT HEAD WITHOUT CONTRAST
TECHNIQUE: Contiguous axial images were obtained from the base of the skull
through the vertex without intravenous contrast.

[Series 2: head wo · axial · 0.44mm/px · z∈[-76,+44]mm · 7 of 34 slices shown, 9 images]
[im 5/34  brain]
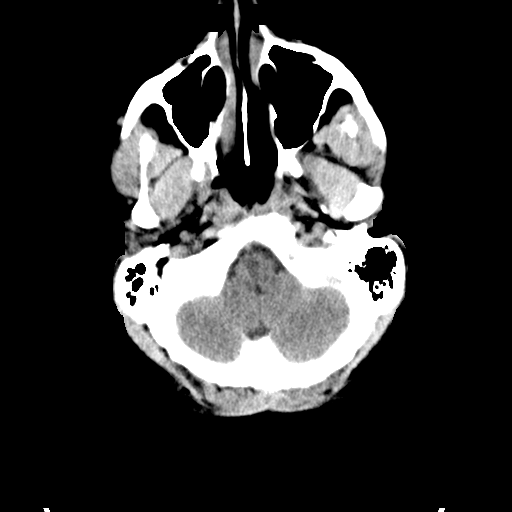
[im 5/34  bone]
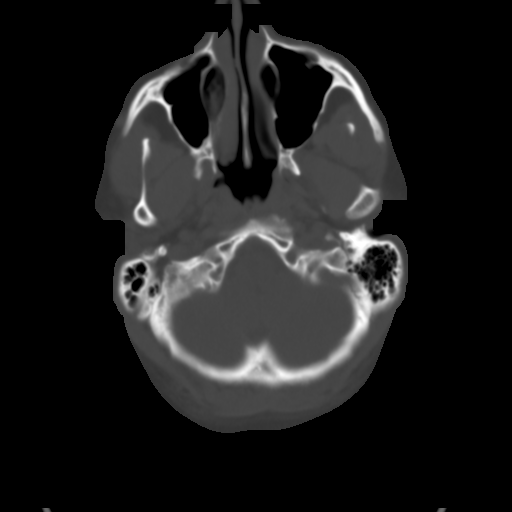
[im 9/34  brain]
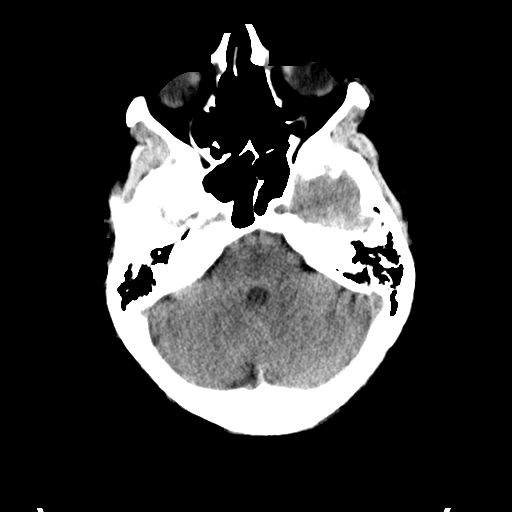
[im 13/34  brain]
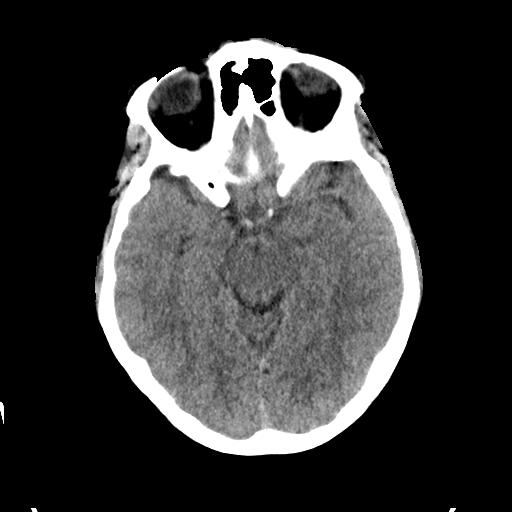
[im 17/34  brain]
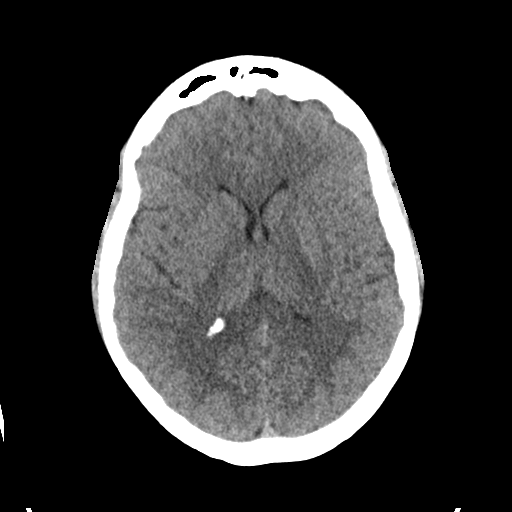
[im 21/34  brain]
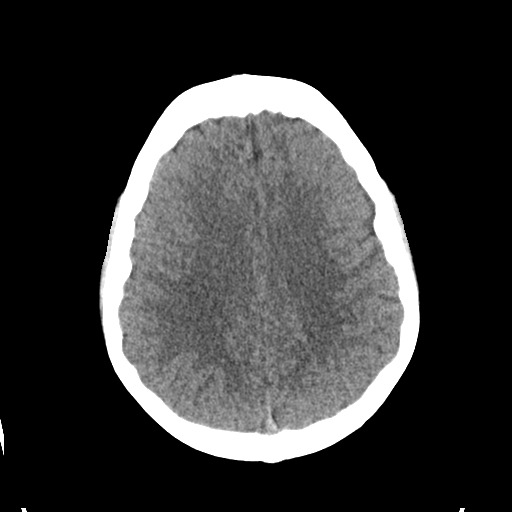
[im 21/34  bone]
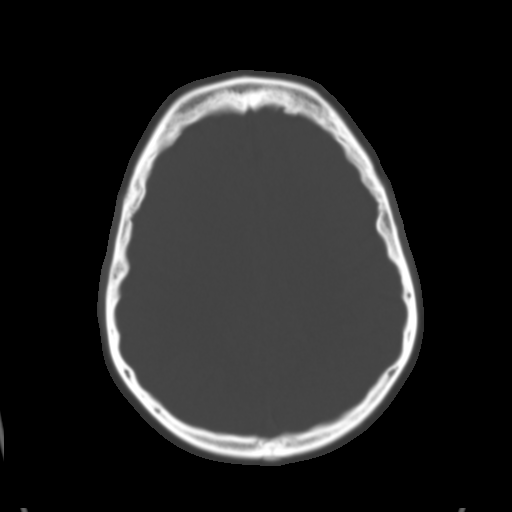
[im 25/34  brain]
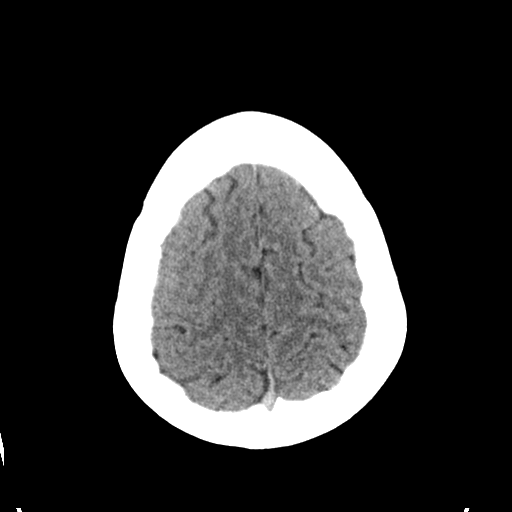
[im 29/34  brain]
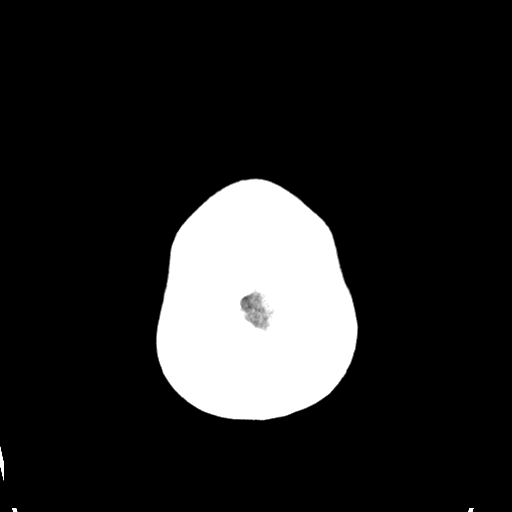

[Series 3: head bone · axial · 0.44mm/px · z∈[-80,-22]mm · 4 of 85 slices shown]
[im 9/85  bone]
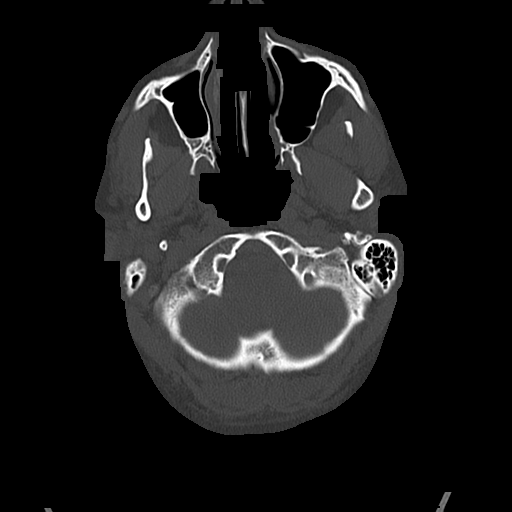
[im 17/85  bone]
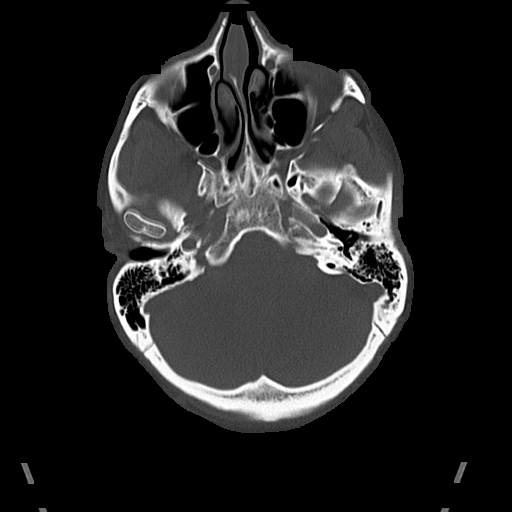
[im 26/85  bone]
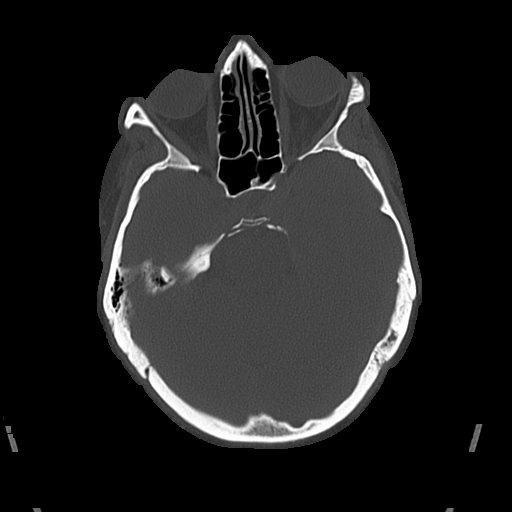
[im 38/85  bone]
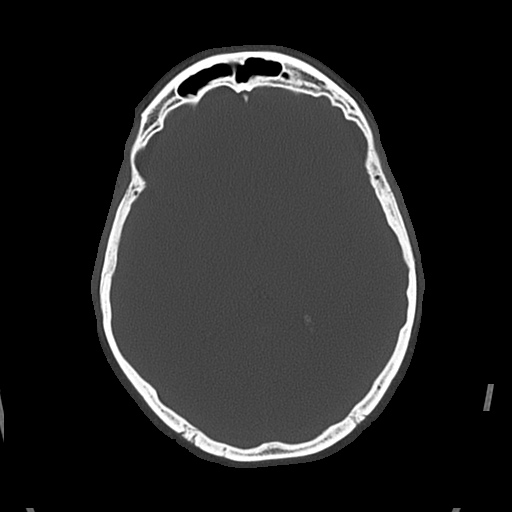

[Series 4: coronal soft · coronal · 0.33mm/px · 3 of 73 slices shown]
[im 25/73  brain]
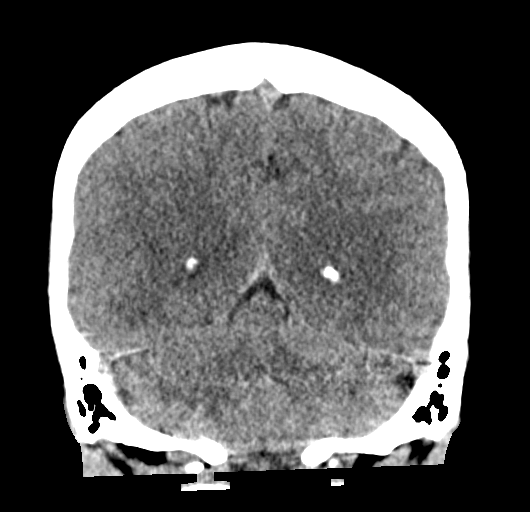
[im 33/73  brain]
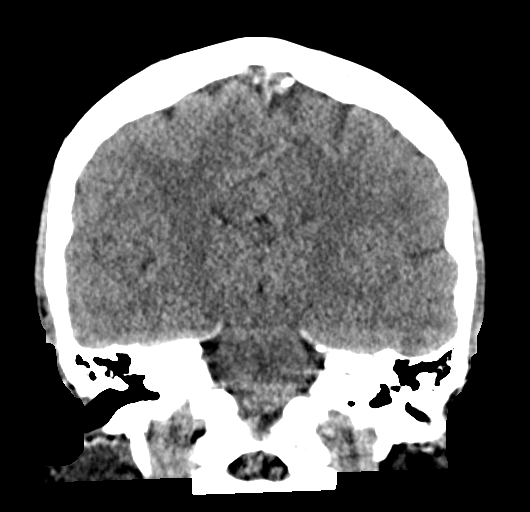
[im 41/73  brain]
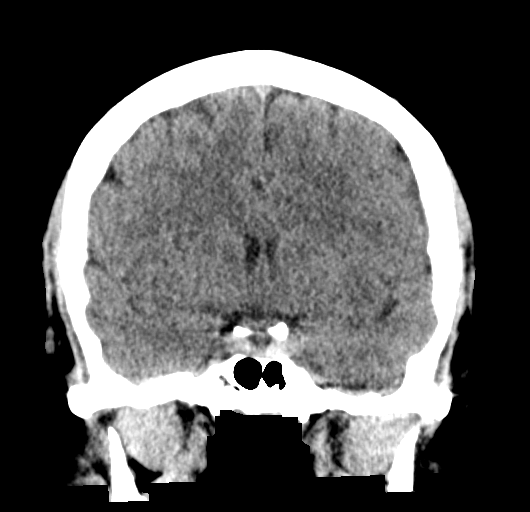

[Series 5: sagittal soft · sagittal · 0.34mm/px · 3 of 59 slices shown]
[im 20/59  brain]
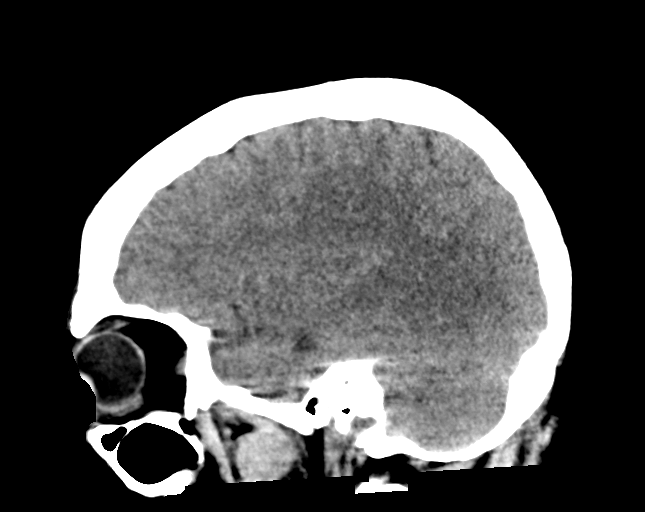
[im 30/59  brain]
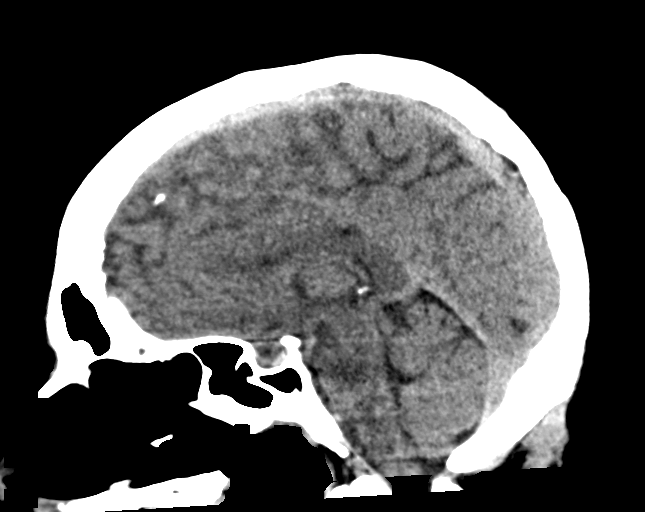
[im 39/59  brain]
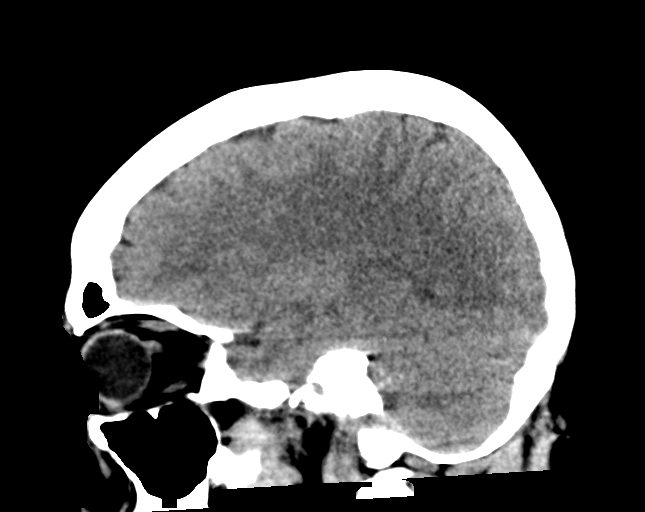

[17 of 47 positions shown; findings below may reference images not displayed]

FINDINGS: Brain: No evidence of acute infarction, hemorrhage, hydrocephalus,
extra-axial collection or mass lesion/mass effect.

Vascular: No hyperdense vessel or unexpected calcification.

Skull: Normal. Negative for fracture or focal lesion.

Sinuses/Orbits: No acute finding.

Other: None.
IMPRESSION: No acute intracranial abnormality.

## 2022-12-15 LAB — SURGICAL PATHOLOGY

## 2022-12-15 NOTE — Progress Notes (Unsigned)
Cardiology Office Note:   Date:  12/15/2022  ID:  Valerie Kim, DOB [birth year], MRN [id number] PCP:  Allwardt, Crist Infante, PA-C  [facility] Providers Cardiologist:  Alverda Skeans, MD Referring MD: Bary Leriche, PA-C  Chief Complaint/Reason for Referral: Cardiology follow-up ASSESSMENT:    1. SVT (supraventricular tachycardia) (HCC)   2. BMI 28.0-28.9,adult     PLAN:   In order of problems listed above: ***        {Are you ordering a CV Procedure (e.g. stress test, cath, DCCV, TEE, etc)?   Press F2        :[id number]}   Dispo:  No follow-ups on file.      Medication Adjustments/Labs and Tests Ordered: Current medicines are reviewed at length with the patient today.  Concerns regarding medicines are outlined above.  The following changes have been made:  {PLAN; NO CHANGE:13088:s}   Labs/tests ordered: No orders of the defined types were placed in this encounter.   Medication Changes: No orders of the defined types were placed in this encounter.   Current medicines are reviewed at length with the patient today.  The patient {ACTIONS; HAS/DOES NOT HAVE:19233} concerns regarding medicines.  I spent *** minutes reviewing all clinical data during and prior to this visit including all relevant imaging studies, laboratories, clinical information from other health systems, and prior notes from both Cardiology and other specialties, interviewing the patient, and conducting a complete physical examination in order to formulate a comprehensive and personalized evaluation and treatment plan.  History of Present Illness:      FOCUSED PROBLEM LIST:   Migraine disorder GERD SVT Ablation 2012 [place] BMI 03 January 2022:  The patient is a 37 y.o. female with the indicated medical history here for recommendations regarding palpitations.  The patient was seen by her primary care provider's office recently.  She was complaining of low back pain hip pain.  She stated that  she has a history of SVT desired to be seen by cardiology.   The patient had an ablation for SVT in [place] in 2012.  Since that time she has been fairly well.  She is had 4 successful pregnancies without any issues.  She hikes and does her activities of daily living without any issues.  She occasionally gets a very sharp twinges of chest discomfort that lasts seconds.  They are not associated with exertion.  She hikes with her family without any issues.  She is otherwise well without significant complaints.  Plan: Follow-up 1 year.         Current Medications: No outpatient medications have been marked as taking for the 12/18/22 encounter (Appointment) with Orbie Pyo, MD.     Review of Systems:   Please see the history of present illness.    All other systems reviewed and are negative.     EKGs/Labs/Other Test Reviewed:   EKG: EKG 2023 demonstrates sinus rhythm  EKG Interpretation Date/Time:    Ventricular Rate:    PR Interval:    QRS Duration:    QT Interval:    QTC Calculation:   R Axis:      Text Interpretation:           Risk Assessment/Calculations:   {Does this patient have ATRIAL FIBRILLATION?[phone]}      Physical Exam:   VS:  LMP 12/07/2022    No BP recorded.  {Refresh Note OR Click here to enter BP  :1}***   Wt Readings from Last 3 Encounters:  12/12/22 170 lb (77.1 kg)  11/28/22 170 lb (77.1 kg)  11/22/22 170 lb 9.6 oz (77.4 kg)      GENERAL:  No apparent distress, AOx3 HEENT:  No carotid bruits, +2 carotid impulses, no scleral icterus CAR: RRR Irregular RR*** no murmurs***, gallops, rubs, or thrills RES:  Clear to auscultation bilaterally ABD:  Soft, nontender, nondistended, positive bowel sounds x 4 VASC:  +2 radial pulses, +2 carotid pulses NEURO:  CN 2-12 grossly intact; motor and sensory grossly intact PSYCH:  No active depression or anxiety EXT:  No edema, ecchymosis, or cyanosis  Signed, Orbie Pyo, MD  12/15/2022 11:36 AM     [facility] [address] [place], [place], [place]  [address] Phone: [phone]; Fax: [phone]   Note:  This document was prepared using Dragon voice recognition software and may include unintentional dictation errors.

## 2022-12-16 ENCOUNTER — Other Ambulatory Visit: Payer: Self-pay | Admitting: Physician Assistant

## 2022-12-16 ENCOUNTER — Encounter: Payer: Self-pay | Admitting: Physician Assistant

## 2022-12-16 DIAGNOSIS — R1013 Epigastric pain: Secondary | ICD-10-CM

## 2022-12-18 ENCOUNTER — Other Ambulatory Visit: Payer: Self-pay | Admitting: Physician Assistant

## 2022-12-18 ENCOUNTER — Ambulatory Visit: Payer: BC Managed Care – PPO | Attending: Internal Medicine | Admitting: Internal Medicine

## 2022-12-18 ENCOUNTER — Other Ambulatory Visit (HOSPITAL_COMMUNITY): Payer: Self-pay

## 2022-12-18 ENCOUNTER — Encounter: Payer: Self-pay | Admitting: Internal Medicine

## 2022-12-18 VITALS — BP 118/80 | HR 87 | Ht 70.0 in | Wt 166.2 lb

## 2022-12-18 DIAGNOSIS — I471 Supraventricular tachycardia, unspecified: Secondary | ICD-10-CM

## 2022-12-18 DIAGNOSIS — Z6828 Body mass index (BMI) 28.0-28.9, adult: Secondary | ICD-10-CM | POA: Diagnosis not present

## 2022-12-18 DIAGNOSIS — R1013 Epigastric pain: Secondary | ICD-10-CM

## 2022-12-18 MED ORDER — DEXLANSOPRAZOLE 60 MG PO CPDR
60.0000 mg | DELAYED_RELEASE_CAPSULE | Freq: Every day | ORAL | 0 refills | Status: DC
  Filled 2023-01-22: qty 30, 30d supply, fill #0

## 2022-12-18 MED ORDER — DEXLANSOPRAZOLE 60 MG PO CPDR
60.0000 mg | DELAYED_RELEASE_CAPSULE | Freq: Every day | ORAL | 0 refills | Status: DC
  Filled 2022-12-18: qty 30, 30d supply, fill #0

## 2022-12-18 NOTE — Telephone Encounter (Signed)
Please see patient message and advise.

## 2022-12-18 NOTE — Patient Instructions (Signed)
Medication Instructions:   Your physician recommends that you continue on your current medications as directed. Please refer to the Current Medication list given to you today.  *If you need a refill on your cardiac medications before your next appointment, please call your pharmacy*    Follow-Up: At [facility], you and your health needs are our priority.  As part of our continuing mission to provide you with exceptional heart care, we have created designated Provider Care Teams.  These Care Teams include your primary Cardiologist (physician) and Advanced Practice Providers (APPs -  Physician Assistants and Nurse Practitioners) who all work together to provide you with the care you need, when you need it.  We recommend signing up for the patient portal called "MyChart".  Sign up information is provided on this After Visit Summary.  MyChart is used to connect with patients for Virtual Visits (Telemedicine).  Patients are able to view lab/test results, encounter notes, upcoming appointments, etc.  Non-urgent messages can be sent to your provider as well.   To learn more about what you can do with MyChart, go to [email or web address].    Your next appointment:   1 year(s)  Provider:   Jari Favre, PA-C, Ronie Spies, PA-C, Robin Searing, NP, Jacolyn Reedy, PA-C, Eligha Bridegroom, NP, Tereso Newcomer, PA-C, or Perlie Gold, PA-C

## 2022-12-19 NOTE — Progress Notes (Signed)
Valerie Kim,  The biopsies taken from your stomach were notable for mild reactive gastropathy which is a common finding and often related to use of certain medications (usually NSAIDs), but there was no evidence of Helicobacter pylori infection. This common finding is not felt to necessarily be a cause of any particular symptom and there is no specific treatment or further evaluation recommended.  The biopsies of your duodenum showed mild reactive changes, likely secondary to stomach acid.  Although the findings could be seen in early or mild celiac disease, your blood tests for celiac disease were negative, which makes celiac disease much less likely.  Please take the Dexilant as prescribed to reduce stomach acid.  Please follow up with me in the office in a few months if your symptoms are not improving.

## 2023-01-01 ENCOUNTER — Ambulatory Visit (HOSPITAL_COMMUNITY): Payer: BC Managed Care – PPO

## 2023-01-01 DIAGNOSIS — N390 Urinary tract infection, site not specified: Secondary | ICD-10-CM | POA: Diagnosis not present

## 2023-01-01 DIAGNOSIS — M545 Low back pain, unspecified: Secondary | ICD-10-CM | POA: Diagnosis not present

## 2023-01-01 DIAGNOSIS — R300 Dysuria: Secondary | ICD-10-CM | POA: Diagnosis not present

## 2023-01-04 ENCOUNTER — Other Ambulatory Visit: Payer: Self-pay | Admitting: Medical Genetics

## 2023-01-07 DIAGNOSIS — Z419 Encounter for procedure for purposes other than remedying health state, unspecified: Secondary | ICD-10-CM | POA: Diagnosis not present

## 2023-01-10 ENCOUNTER — Other Ambulatory Visit: Payer: Self-pay | Admitting: Medical Genetics

## 2023-01-10 ENCOUNTER — Encounter: Payer: Self-pay | Admitting: Physician Assistant

## 2023-01-10 ENCOUNTER — Ambulatory Visit: Payer: BC Managed Care – PPO | Admitting: Physician Assistant

## 2023-01-10 VITALS — BP 93/63 | HR 71 | Temp 98.2°F | Ht 70.0 in | Wt 166.4 lb

## 2023-01-10 DIAGNOSIS — Z760 Encounter for issue of repeat prescription: Secondary | ICD-10-CM | POA: Insufficient documentation

## 2023-01-10 DIAGNOSIS — Z0100 Encounter for examination of eyes and vision without abnormal findings: Secondary | ICD-10-CM | POA: Insufficient documentation

## 2023-01-10 DIAGNOSIS — I10 Essential (primary) hypertension: Secondary | ICD-10-CM | POA: Insufficient documentation

## 2023-01-10 DIAGNOSIS — G479 Sleep disorder, unspecified: Secondary | ICD-10-CM | POA: Insufficient documentation

## 2023-01-10 DIAGNOSIS — F319 Bipolar disorder, unspecified: Secondary | ICD-10-CM | POA: Insufficient documentation

## 2023-01-10 DIAGNOSIS — M461 Sacroiliitis, not elsewhere classified: Secondary | ICD-10-CM | POA: Insufficient documentation

## 2023-01-10 DIAGNOSIS — H6990 Unspecified Eustachian tube disorder, unspecified ear: Secondary | ICD-10-CM | POA: Insufficient documentation

## 2023-01-10 DIAGNOSIS — Z720 Tobacco use: Secondary | ICD-10-CM | POA: Insufficient documentation

## 2023-01-10 DIAGNOSIS — G43109 Migraine with aura, not intractable, without status migrainosus: Secondary | ICD-10-CM | POA: Insufficient documentation

## 2023-01-10 DIAGNOSIS — Z0000 Encounter for general adult medical examination without abnormal findings: Secondary | ICD-10-CM

## 2023-01-10 DIAGNOSIS — G43909 Migraine, unspecified, not intractable, without status migrainosus: Secondary | ICD-10-CM | POA: Insufficient documentation

## 2023-01-10 DIAGNOSIS — R109 Unspecified abdominal pain: Secondary | ICD-10-CM | POA: Insufficient documentation

## 2023-01-10 DIAGNOSIS — R079 Chest pain, unspecified: Secondary | ICD-10-CM | POA: Insufficient documentation

## 2023-01-10 DIAGNOSIS — F4310 Post-traumatic stress disorder, unspecified: Secondary | ICD-10-CM | POA: Insufficient documentation

## 2023-01-10 DIAGNOSIS — R5381 Other malaise: Secondary | ICD-10-CM | POA: Insufficient documentation

## 2023-01-10 DIAGNOSIS — M25559 Pain in unspecified hip: Secondary | ICD-10-CM | POA: Insufficient documentation

## 2023-01-10 DIAGNOSIS — Z124 Encounter for screening for malignant neoplasm of cervix: Secondary | ICD-10-CM | POA: Insufficient documentation

## 2023-01-10 DIAGNOSIS — Z0289 Encounter for other administrative examinations: Secondary | ICD-10-CM | POA: Insufficient documentation

## 2023-01-10 DIAGNOSIS — Z8679 Personal history of other diseases of the circulatory system: Secondary | ICD-10-CM | POA: Insufficient documentation

## 2023-01-10 DIAGNOSIS — J329 Chronic sinusitis, unspecified: Secondary | ICD-10-CM | POA: Insufficient documentation

## 2023-01-10 DIAGNOSIS — R1310 Dysphagia, unspecified: Secondary | ICD-10-CM | POA: Insufficient documentation

## 2023-01-10 DIAGNOSIS — Z23 Encounter for immunization: Secondary | ICD-10-CM | POA: Insufficient documentation

## 2023-01-10 DIAGNOSIS — Z710 Person encountering health services to consult on behalf of another person: Secondary | ICD-10-CM | POA: Insufficient documentation

## 2023-01-10 DIAGNOSIS — M797 Fibromyalgia: Secondary | ICD-10-CM | POA: Insufficient documentation

## 2023-01-10 DIAGNOSIS — R002 Palpitations: Secondary | ICD-10-CM | POA: Insufficient documentation

## 2023-01-10 DIAGNOSIS — F429 Obsessive-compulsive disorder, unspecified: Secondary | ICD-10-CM | POA: Insufficient documentation

## 2023-01-10 DIAGNOSIS — K529 Noninfective gastroenteritis and colitis, unspecified: Secondary | ICD-10-CM | POA: Insufficient documentation

## 2023-01-10 DIAGNOSIS — R21 Rash and other nonspecific skin eruption: Secondary | ICD-10-CM | POA: Insufficient documentation

## 2023-01-10 DIAGNOSIS — O139 Gestational [pregnancy-induced] hypertension without significant proteinuria, unspecified trimester: Secondary | ICD-10-CM | POA: Insufficient documentation

## 2023-01-10 DIAGNOSIS — Z1331 Encounter for screening for depression: Secondary | ICD-10-CM | POA: Insufficient documentation

## 2023-01-10 DIAGNOSIS — F4010 Social phobia, unspecified: Secondary | ICD-10-CM | POA: Insufficient documentation

## 2023-01-10 DIAGNOSIS — N390 Urinary tract infection, site not specified: Secondary | ICD-10-CM | POA: Insufficient documentation

## 2023-01-10 DIAGNOSIS — R87612 Low grade squamous intraepithelial lesion on cytologic smear of cervix (LGSIL): Secondary | ICD-10-CM | POA: Insufficient documentation

## 2023-01-10 DIAGNOSIS — F17200 Nicotine dependence, unspecified, uncomplicated: Secondary | ICD-10-CM | POA: Insufficient documentation

## 2023-01-10 DIAGNOSIS — Z139 Encounter for screening, unspecified: Secondary | ICD-10-CM | POA: Insufficient documentation

## 2023-01-10 DIAGNOSIS — M2550 Pain in unspecified joint: Secondary | ICD-10-CM | POA: Insufficient documentation

## 2023-01-10 DIAGNOSIS — L909 Atrophic disorder of skin, unspecified: Secondary | ICD-10-CM | POA: Insufficient documentation

## 2023-01-10 DIAGNOSIS — M940 Chondrocostal junction syndrome [Tietze]: Secondary | ICD-10-CM | POA: Insufficient documentation

## 2023-01-10 DIAGNOSIS — Z87891 Personal history of nicotine dependence: Secondary | ICD-10-CM | POA: Insufficient documentation

## 2023-01-10 DIAGNOSIS — Z111 Encounter for screening for respiratory tuberculosis: Secondary | ICD-10-CM | POA: Insufficient documentation

## 2023-01-10 DIAGNOSIS — F32A Depression, unspecified: Secondary | ICD-10-CM | POA: Insufficient documentation

## 2023-01-10 DIAGNOSIS — Z3480 Encounter for supervision of other normal pregnancy, unspecified trimester: Secondary | ICD-10-CM | POA: Insufficient documentation

## 2023-01-10 HISTORY — DX: Gestational (pregnancy-induced) hypertension without significant proteinuria, unspecified trimester: O13.9

## 2023-01-10 HISTORY — DX: Encounter for general adult medical examination without abnormal findings: Z00.00

## 2023-01-10 HISTORY — DX: Encounter for other administrative examinations: Z02.89

## 2023-01-10 HISTORY — DX: Person encountering health services to consult on behalf of another person: Z71.0

## 2023-01-10 HISTORY — DX: Encounter for immunization: Z23

## 2023-01-10 HISTORY — DX: Essential (primary) hypertension: I10

## 2023-01-10 HISTORY — DX: Encounter for supervision of other normal pregnancy, unspecified trimester: Z34.80

## 2023-01-10 LAB — CBC WITH DIFFERENTIAL/PLATELET
Basophils Absolute: 0 10*3/uL (ref 0.0–0.1)
Basophils Relative: 0.6 % (ref 0.0–3.0)
Eosinophils Absolute: 0.2 10*3/uL (ref 0.0–0.7)
Eosinophils Relative: 3.7 % (ref 0.0–5.0)
HCT: 41.7 % (ref 36.0–46.0)
Hemoglobin: 14.1 g/dL (ref 12.0–15.0)
Lymphocytes Relative: 34.3 % (ref 12.0–46.0)
Lymphs Abs: 1.5 10*3/uL (ref 0.7–4.0)
MCHC: 33.7 g/dL (ref 30.0–36.0)
MCV: 89.4 fL (ref 78.0–100.0)
Monocytes Absolute: 0.5 10*3/uL (ref 0.1–1.0)
Monocytes Relative: 10.9 % (ref 3.0–12.0)
Neutro Abs: 2.2 10*3/uL (ref 1.4–7.7)
Neutrophils Relative %: 50.5 % (ref 43.0–77.0)
Platelets: 380 10*3/uL (ref 150.0–400.0)
RBC: 4.67 Mil/uL (ref 3.87–5.11)
RDW: 12.1 % (ref 11.5–15.5)
WBC: 4.3 10*3/uL (ref 4.0–10.5)

## 2023-01-10 LAB — COMPREHENSIVE METABOLIC PANEL
ALT: 11 U/L (ref 0–35)
AST: 16 U/L (ref 0–37)
Albumin: 4.4 g/dL (ref 3.5–5.2)
Alkaline Phosphatase: 65 U/L (ref 39–117)
BUN: 11 mg/dL (ref 6–23)
CO2: 29 meq/L (ref 19–32)
Calcium: 9.4 mg/dL (ref 8.4–10.5)
Chloride: 104 meq/L (ref 96–112)
Creatinine, Ser: 0.8 mg/dL (ref 0.40–1.20)
GFR: 93.38 mL/min (ref >60.00)
Glucose, Bld: 82 mg/dL (ref 70–99)
Potassium: 3.8 meq/L (ref 3.5–5.1)
Sodium: 140 meq/L (ref 135–145)
Total Bilirubin: 0.5 mg/dL (ref 0.2–1.2)
Total Protein: 6.9 g/dL (ref 6.0–8.3)

## 2023-01-10 LAB — LIPID PANEL
Cholesterol: 171 mg/dL (ref 0–200)
HDL: 43.3 mg/dL (ref >39.00)
LDL Cholesterol: 114 mg/dL — ABNORMAL HIGH (ref 0–99)
NonHDL: 128.13
Total CHOL/HDL Ratio: 4
Triglycerides: 69 mg/dL (ref 0.0–149.0)
VLDL: 13.8 mg/dL (ref 0.0–40.0)

## 2023-01-10 LAB — TSH: TSH: 1.84 u[IU]/mL (ref 0.35–5.50)

## 2023-01-10 NOTE — Progress Notes (Signed)
Patient ID: Valerie Kim, female    DOB: 05/18/1985, 37 y.o.   MRN: [id number]   Assessment & Plan:  Encounter for annual physical exam -     CBC with Differential/Platelet -     Comprehensive metabolic panel -     Lipid panel -     TSH  Immunization due -     HPV 9-valent vaccine,Recombinat    Age-appropriate screening and counseling performed today. Will check labs and call with results. Preventive measures discussed and printed in AVS for patient.   Patient Counseling: [x]   Nutrition: Stressed importance of moderation in sodium/caffeine intake, saturated fat and cholesterol, caloric balance, sufficient intake of fresh fruits, vegetables, and fiber.  [x]   Stressed the importance of regular exercise.   [x]   Substance Abuse: Discussed cessation/primary prevention of tobacco, alcohol, or other drug use; driving or other dangerous activities under the influence; availability of treatment for abuse.   []   Injury prevention: Discussed safety belts, safety helmets, smoke detector, smoking near bedding or upholstery.   []   Sexuality: Discussed sexually transmitted diseases, partner selection, use of condoms, avoidance of unintended pregnancy  and contraceptive alternatives.   [x]   Dental health: Discussed importance of regular tooth brushing, flossing, and dental visits.  [x]   Health maintenance and immunizations reviewed. Please refer to Health maintenance section.        No follow-ups on file.    Subjective:    Chief Complaint  Patient presents with   Annual Exam    Patient is fasting    HPI Patient is in today for annual exam.  Health maintenance: Lifestyle/ exercise: Walking while in [place], trying to resume this at home too Nutrition: Eating more regularly, Boost shakes Mental health: starts counseling next week  Caffeine: soda daily, usually helps migraines  Sleep: doing good Colonoscopy: start at 44 Pap: UTD Mammogram: start at 40 Immunizations:  UTD  Past Medical History:  Diagnosis Date   Allergies 11/22/2022   Annual physical exam 08/19/2020   Anxiety    Common cold 11/22/2022   increase po fluids, rest, tylenol prn, robitussin prn cough     Depression    Encounter for postpartum care of lactating mother 01/10/2023   Encounter for supervision of normal pregnancy in multigravida 01/10/2023   s=d; reassuring fwb. reviewed 2nd trim prec in detail.     Endometriosis    Essential hypertension 01/10/2023   GERD (gastroesophageal reflux disease)    Health examination of defined subpopulation 01/10/2023   Migraines    Need for prophylactic vaccination with combined vaccine 01/10/2023   Need for prophylactic vaccination with measles-mumps-rubella (MMR) vaccine 01/10/2023   Person encountering health services to consult on behalf of another person 01/10/2023   Routine general medical examination at a health care facility 01/10/2023   Routine postpartum follow-up 01/10/2023   Routine postpartum follow-up 01/10/2023   Thyroid disease 2020   3mm nodule noted and monitored through [place]   Transient hypertension of pregnancy, antepartum 01/10/2023    Past Surgical History:  Procedure Laterality Date   ABLATION     cardiac   HYSTEROSCOPY      Family History  Problem Relation Age of Onset   Hyperlipidemia Mother    Thyroid disease Mother    Depression Mother    Bipolar disorder Mother    Arthritis Mother    Miscarriages / [place] Mother    Varicose Veins Mother    Hyperlipidemia Father    Alcohol abuse Father  Depression Father    Bipolar disorder Father    Arthritis Father    Hypertension Father    ADD / ADHD Brother    Hypertension Brother    Learning disabilities Brother    Hypertension Paternal Grandmother    Heart disease Paternal Grandmother    Heart disease Paternal Grandfather    Hypertension Paternal Grandfather    Anxiety disorder Daughter    Depression Daughter    Anxiety disorder Daughter     Depression Daughter    ADD / ADHD Son    Anxiety disorder Daughter    Colon cancer Neg Hx    Esophageal cancer Neg Hx    Rectal cancer Neg Hx    Stomach cancer Neg Hx     Social History   Tobacco Use   Smoking status: Former    Current packs/day: 0.00    Average packs/day: 1.5 packs/day for 15.0 years (22.5 ttl pk-yrs)    Types: Cigarettes, E-cigarettes    Start date: 02/07/2000    Quit date: 02/07/2015    Years since quitting: 7.9   Smokeless tobacco: Never  Vaping Use   Vaping status: Every Day   Substances: Nicotine  Substance Use Topics   Alcohol use: Yes    Comment: Occassional social 1-2 drinks   Drug use: Yes    Frequency: 7.0 times per week    Types: Marijuana, Psilocybin     No Known Allergies  Review of Systems NEGATIVE UNLESS OTHERWISE INDICATED IN HPI      Objective:     BP 93/63   Pulse 71   Temp 98.2 F (36.8 C) (Temporal)   Ht 5\' 10"  (1.778 m)   Wt 166 lb 6.4 oz (75.5 kg)   LMP 01/02/2023 (Exact Date)   SpO2 100%   BMI 23.88 kg/m   Wt Readings from Last 3 Encounters:  01/10/23 166 lb 6.4 oz (75.5 kg)  12/18/22 166 lb 3.2 oz (75.4 kg)  12/12/22 170 lb (77.1 kg)    BP Readings from Last 3 Encounters:  01/10/23 93/63  12/18/22 118/80  12/12/22 117/81     Physical Exam Vitals and nursing note reviewed.  Constitutional:      Appearance: Normal appearance. She is normal weight. She is not toxic-appearing.  HENT:     Head: Normocephalic and atraumatic.     Right Ear: Tympanic membrane, ear canal and external ear normal.     Left Ear: Tympanic membrane, ear canal and external ear normal.     Nose: Nose normal.     Mouth/Throat:     Mouth: Mucous membranes are moist.  Eyes:     Extraocular Movements: Extraocular movements intact.     Conjunctiva/sclera: Conjunctivae normal.     Pupils: Pupils are equal, round, and reactive to light.  Cardiovascular:     Rate and Rhythm: Normal rate and regular rhythm.     Pulses: Normal pulses.      Heart sounds: Normal heart sounds.  Pulmonary:     Effort: Pulmonary effort is normal.     Breath sounds: Normal breath sounds.  Abdominal:     General: Abdomen is flat. Bowel sounds are normal.     Palpations: Abdomen is soft.  Musculoskeletal:        General: Normal range of motion.     Cervical back: Normal range of motion and neck supple.  Skin:    General: Skin is warm and dry.     Findings: No lesion or rash.  Neurological:  General: No focal deficit present.     Mental Status: She is alert and oriented to person, place, and time.  Psychiatric:        Mood and Affect: Mood normal.        Behavior: Behavior normal.        Thought Content: Thought content normal.        Judgment: Judgment normal.       Vivianne Carles M Mozes Sagar, PA-C

## 2023-01-16 ENCOUNTER — Ambulatory Visit: Payer: BC Managed Care – PPO | Admitting: Behavioral Health

## 2023-01-16 ENCOUNTER — Other Ambulatory Visit (HOSPITAL_COMMUNITY)
Admission: RE | Admit: 2023-01-16 | Discharge: 2023-01-16 | Disposition: A | Discharge status: Home / Self Care | Payer: Self-pay | Source: Ambulatory Visit | Attending: Oncology | Admitting: Oncology

## 2023-01-16 DIAGNOSIS — F4312 Post-traumatic stress disorder, chronic: Secondary | ICD-10-CM

## 2023-01-16 DIAGNOSIS — F411 Generalized anxiety disorder: Secondary | ICD-10-CM

## 2023-01-16 DIAGNOSIS — F331 Major depressive disorder, recurrent, moderate: Secondary | ICD-10-CM
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
## 2023-01-16 NOTE — Progress Notes (Signed)
                Anastasya Jewell L Farryn Linares, LMFT 

## 2023-01-22 ENCOUNTER — Other Ambulatory Visit (HOSPITAL_COMMUNITY): Payer: Self-pay

## 2023-01-27 LAB — GENECONNECT MOLECULAR SCREEN: Genetic Analysis Overall Interpretation: NEGATIVE

## 2023-02-06 ENCOUNTER — Ambulatory Visit: Payer: BC Managed Care – PPO | Admitting: Behavioral Health

## 2023-02-06 NOTE — Progress Notes (Unsigned)
                Valerie Kim L Farryn Linares, LMFT 

## 2023-02-07 DIAGNOSIS — Z419 Encounter for procedure for purposes other than remedying health state, unspecified: Secondary | ICD-10-CM | POA: Diagnosis not present

## 2023-02-18 ENCOUNTER — Other Ambulatory Visit: Payer: Self-pay | Admitting: Physician Assistant

## 2023-02-18 DIAGNOSIS — R1013 Epigastric pain: Secondary | ICD-10-CM

## 2023-02-19 ENCOUNTER — Other Ambulatory Visit (HOSPITAL_COMMUNITY): Payer: Self-pay

## 2023-02-19 MED ORDER — DEXLANSOPRAZOLE 60 MG PO CPDR
60.0000 mg | DELAYED_RELEASE_CAPSULE | Freq: Every day | ORAL | 0 refills | Status: DC
  Filled 2023-02-19: qty 30, 30d supply, fill #0

## 2023-02-22 ENCOUNTER — Ambulatory Visit: Payer: BC Managed Care – PPO | Admitting: Physician Assistant

## 2023-02-27 ENCOUNTER — Ambulatory Visit: Payer: BC Managed Care – PPO | Admitting: Gastroenterology

## 2023-03-05 ENCOUNTER — Ambulatory Visit: Payer: BC Managed Care – PPO | Admitting: Behavioral Health

## 2023-03-07 ENCOUNTER — Ambulatory Visit (INDEPENDENT_AMBULATORY_CARE_PROVIDER_SITE_OTHER): Payer: BC Managed Care – PPO | Admitting: Physician Assistant

## 2023-03-07 ENCOUNTER — Other Ambulatory Visit (HOSPITAL_COMMUNITY): Payer: Self-pay

## 2023-03-07 VITALS — BP 112/78 | HR 71 | Temp 97.3°F | Ht 70.0 in | Wt 167.2 lb

## 2023-03-07 DIAGNOSIS — F32A Depression, unspecified: Secondary | ICD-10-CM | POA: Diagnosis not present

## 2023-03-07 DIAGNOSIS — L509 Urticaria, unspecified: Secondary | ICD-10-CM

## 2023-03-07 DIAGNOSIS — F419 Anxiety disorder, unspecified: Secondary | ICD-10-CM | POA: Diagnosis not present

## 2023-03-07 MED ORDER — FAMOTIDINE 20 MG PO TABS
20.0000 mg | ORAL_TABLET | Freq: Two times a day (BID) | ORAL | 0 refills | Status: DC
  Filled 2023-03-07: qty 60, 30d supply, fill #0

## 2023-03-07 MED ORDER — CETIRIZINE HCL 10 MG PO TABS
10.0000 mg | ORAL_TABLET | Freq: Every day | ORAL | 2 refills | Status: DC
  Filled 2023-03-07: qty 30, 30d supply, fill #0
  Filled 2023-04-01: qty 30, 30d supply, fill #1
  Filled 2023-04-29: qty 30, 30d supply, fill #2

## 2023-03-07 MED ORDER — SERTRALINE HCL 50 MG PO TABS
50.0000 mg | ORAL_TABLET | Freq: Every day | ORAL | 1 refills | Status: DC
  Filled 2023-03-07: qty 90, 90d supply, fill #0

## 2023-03-07 NOTE — Progress Notes (Signed)
Patient ID: Valerie Kim, female    DOB: 10-31-1985, 38 y.o.   MRN: [id number]   Assessment & Plan:  Urticaria -     Cetirizine HCl; Take 1 tablet (10 mg total) by mouth at bedtime.  Dispense: 30 tablet; Refill: 2 -     Famotidine; Take 1 tablet (20 mg total) by mouth 2 (two) times daily.  Dispense: 60 tablet; Refill: 0  Anxiety and depression -     Sertraline HCl; Take 1 tablet (50 mg total) by mouth at bedtime.  Dispense: 90 tablet; Refill: 1    Assessment and Plan    Urticarial Rash New onset, itchy, urticarial rash with some scarring, predominantly above the waist, ongoing for about a month. No known triggers identified. No response to hydrocortisone or Benadryl. Possible stress-related etiology. -Start Zyrtec at bedtime and Famotidine twice a day. -Advise to maintain cool skin temperature, stay well hydrated, and avoid new potential allergens.  Depression History of depression, previously managed with Zoloft and Lamictal. Currently off medications for over a year and experiencing increased stress. -Start Zoloft 25mg  daily, with plan to increase to 50mg  after a week. -Follow-up in 3-4 weeks to assess response and adjust dosage if necessary.        Subjective:    Chief Complaint  Patient presents with   Rash    Pt in office c/o skin issues on abdomen; arms, neck face; appeared the past month and itching very bad per patient. No changes in diet, skin products or soaps; pt has tried OTC hydrocortisone with no relief;     HPI Discussed the use of AI scribe software for clinical note transcription with the patient, who gave verbal consent to proceed.  History of Present Illness   The patient, with a history of depression and stress, presents with a rash that has been ongoing for about a month. The rash is itchy and leaves scarring. The rash appears in new spots every couple of days and is located on the patient's back, feet, neck, face, and ear. The patient has not  noticed any similar symptoms in her household members or pets. The patient has tried hydrocortisone and Benadryl for symptom relief, but these have only provided temporary relief. The patient also discusses mental health concerns, noting increased stress due to work and current events. The patient has previously been on Zoloft and Lamictal for depression, but has not taken these medications in over a year. The patient has also tried Prozac, Celexa, and Wellbutrin in the past.       Past Medical History:  Diagnosis Date   Allergies 11/22/2022   Annual physical exam 08/19/2020   Anxiety    Common cold 11/22/2022   increase po fluids, rest, tylenol prn, robitussin prn cough     Depression    Encounter for postpartum care of lactating mother 01/10/2023   Encounter for supervision of normal pregnancy in multigravida 01/10/2023   s=d; reassuring fwb. reviewed 2nd trim prec in detail.     Endometriosis    Essential hypertension 01/10/2023   GERD (gastroesophageal reflux disease)    Health examination of defined subpopulation 01/10/2023   Migraines    Need for prophylactic vaccination with combined vaccine 01/10/2023   Need for prophylactic vaccination with measles-mumps-rubella (MMR) vaccine 01/10/2023   Person encountering health services to consult on behalf of another person 01/10/2023   Routine general medical examination at a health care facility 01/10/2023   Routine postpartum follow-up 01/10/2023   Routine postpartum  follow-up 01/10/2023   Thyroid disease 2020   3mm nodule noted and monitored through [place]   Transient hypertension of pregnancy, antepartum 01/10/2023    Past Surgical History:  Procedure Laterality Date   ABLATION     cardiac   HYSTEROSCOPY      Family History  Problem Relation Age of Onset   Hyperlipidemia Mother    Thyroid disease Mother    Depression Mother    Bipolar disorder Mother    Arthritis Mother    Miscarriages / [place] Mother    Varicose  Veins Mother    Hyperlipidemia Father    Alcohol abuse Father    Depression Father    Bipolar disorder Father    Arthritis Father    Hypertension Father    ADD / ADHD Brother    Hypertension Brother    Learning disabilities Brother    Hypertension Paternal Grandmother    Heart disease Paternal Grandmother    Heart disease Paternal Grandfather    Hypertension Paternal Grandfather    Anxiety disorder Daughter    Depression Daughter    Anxiety disorder Daughter    Depression Daughter    ADD / ADHD Son    Anxiety disorder Daughter    Colon cancer Neg Hx    Esophageal cancer Neg Hx    Rectal cancer Neg Hx    Stomach cancer Neg Hx     Social History   Tobacco Use   Smoking status: Former    Current packs/day: 0.00    Average packs/day: 1.5 packs/day for 15.0 years (22.5 ttl pk-yrs)    Types: Cigarettes, E-cigarettes    Start date: 02/07/2000    Quit date: 02/07/2015    Years since quitting: 8.0   Smokeless tobacco: Never  Vaping Use   Vaping status: Every Day   Substances: Nicotine  Substance Use Topics   Alcohol use: Yes    Comment: Occassional social 1-2 drinks   Drug use: Yes    Frequency: 7.0 times per week    Types: Marijuana, Psilocybin     No Known Allergies  Review of Systems NEGATIVE UNLESS OTHERWISE INDICATED IN HPI      Objective:     BP 112/78 (BP Location: Left Arm, Patient Position: Sitting, Cuff Size: Normal)   Pulse 71   Temp (!) 97.3 F (36.3 C) (Temporal)   Ht 5\' 10"  (1.778 m)   Wt 167 lb 3.2 oz (75.8 kg)   LMP 02/25/2023 (Exact Date)   SpO2 100%   BMI 23.99 kg/m   Wt Readings from Last 3 Encounters:  03/07/23 167 lb 3.2 oz (75.8 kg)  01/10/23 166 lb 6.4 oz (75.5 kg)  12/18/22 166 lb 3.2 oz (75.4 kg)    BP Readings from Last 3 Encounters:  03/07/23 112/78  01/10/23 93/63  12/18/22 118/80     Physical Exam Vitals and nursing note reviewed.  Constitutional:      Appearance: Normal appearance.  Cardiovascular:     Rate and  Rhythm: Normal rate and regular rhythm.  Pulmonary:     Effort: Pulmonary effort is normal.     Breath sounds: Normal breath sounds.  Skin:    Findings: Rash (scattered diffuse, urticarial lesions - a few on R arm, back, chest today) present.  Neurological:     General: No focal deficit present.     Mental Status: She is alert and oriented to person, place, and time.  Psychiatric:        Mood and Affect: Mood  normal.        Behavior: Behavior normal.       Modean Mccullum M Abdoul Encinas, PA-C

## 2023-03-10 DIAGNOSIS — Z419 Encounter for procedure for purposes other than remedying health state, unspecified: Secondary | ICD-10-CM | POA: Diagnosis not present

## 2023-03-12 ENCOUNTER — Ambulatory Visit: Payer: BC Managed Care – PPO | Admitting: Behavioral Health

## 2023-03-12 DIAGNOSIS — F331 Major depressive disorder, recurrent, moderate: Secondary | ICD-10-CM | POA: Diagnosis not present

## 2023-03-12 DIAGNOSIS — F4312 Post-traumatic stress disorder, chronic: Secondary | ICD-10-CM

## 2023-03-12 DIAGNOSIS — F411 Generalized anxiety disorder: Secondary | ICD-10-CM | POA: Diagnosis not present

## 2023-03-12 NOTE — Progress Notes (Signed)
   Valerie Lever, LMFT
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
## 2023-03-29 ENCOUNTER — Ambulatory Visit: Payer: BC Managed Care – PPO | Admitting: Physician Assistant

## 2023-03-29 ENCOUNTER — Encounter: Payer: Self-pay | Admitting: Physician Assistant

## 2023-03-29 ENCOUNTER — Other Ambulatory Visit: Payer: BC Managed Care – PPO

## 2023-03-29 VITALS — BP 116/82 | HR 70 | Ht 70.0 in | Wt 175.4 lb

## 2023-03-29 DIAGNOSIS — R1010 Upper abdominal pain, unspecified: Secondary | ICD-10-CM | POA: Diagnosis not present

## 2023-03-29 DIAGNOSIS — R1013 Epigastric pain: Secondary | ICD-10-CM | POA: Diagnosis not present

## 2023-03-29 DIAGNOSIS — F419 Anxiety disorder, unspecified: Secondary | ICD-10-CM | POA: Diagnosis not present

## 2023-03-29 DIAGNOSIS — R634 Abnormal weight loss: Secondary | ICD-10-CM

## 2023-03-29 DIAGNOSIS — K582 Mixed irritable bowel syndrome: Secondary | ICD-10-CM

## 2023-03-29 DIAGNOSIS — L509 Urticaria, unspecified: Secondary | ICD-10-CM | POA: Diagnosis not present

## 2023-03-29 DIAGNOSIS — G43909 Migraine, unspecified, not intractable, without status migrainosus: Secondary | ICD-10-CM

## 2023-03-29 DIAGNOSIS — K219 Gastro-esophageal reflux disease without esophagitis: Secondary | ICD-10-CM

## 2023-03-29 NOTE — Progress Notes (Signed)
03/29/2023 Kearsten Rossell [id number] July 13, 1985  Referring provider: Allwardt, Crist Infante, PA-C Primary GI doctor: Dr. Tomasa Rand  ASSESSMENT AND PLAN:      AB pain has improved with dexilant/pepcid, not constant but still present Bowels have improved with fiber, weight has improved Worse in the evening, worse with laying on that side Some radiation to her back, very pinpoint She is on ibuprofen 400 mg 2-3 x a daily Check alpha gal panel with meat intolerance, urticaria and tick exposure Chronic AB wall pain- 80% women- RUQ, Carnett's, pinch test, and trigger point injection will get Serum tryptase to rule out Mast cell activation syndrome with AB pain + throat scratching, chest, rhinitis, skin rashes But most likely this is NSAID induced GERD which has improved and chronic abdominal wall pain with positive Carnett's sign on exam -Try over-the-counter lidocaine patches for possible musculoskeletal pain. -Consider referral to sports medicine for ultrasound-guided injection if pain persists.  Urticaria with some allergy symptoms On zyrtec and the pepcid Will test mast cell activation syndrome  Anxiety Started on zoloft, stopped a few days ago Seeing a counselor Follow-up could be component of functional dyspepsia  Migraines Daily migraine use which is likely contributing to rebound headaches as well as patient's reflux Suggest following up primary care seeing neurologist to discuss preventative medications to decrease NSAID use   Patient Care Team: Allwardt, Crist Infante, PA-C as PCP - General (Physician Assistant) Orbie Pyo, MD as PCP - Cardiology (Cardiology)  HISTORY OF PRESENT ILLNESS: 38 y.o. female with a past medical history of anxiety, depression, migraines, endometriosis and others listed below presents for evaluation of GERD, weight loss.   Patient previously saw Dr. Tomasa Rand 11/28/2022 as a new patient for GERD, epigastric pain, unintentional weight loss  and alternating constipation diarrhea.  Prior to that patient had unremarkable CT abdomen pelvis and barium swallow. Endoscopy on 12/12/2022 was overall unremarkable with no explanation of patient's symptoms.  Biopsy of duodenum showed no villous blunting or acute inflammation some focal lymphocytes likely from stomach acid.  Negative H. pylori. Negative celiac testing previously 2021. Started on dexilant 60 mg daily.  Here for follow-up.  Discussed the use of AI scribe software for clinical note transcription with the patient, who gave verbal consent to proceed.  History of Present Illness   The patient, with a history of treatment-resistant depression, presents with chronic abdominal pain, hives, sinus issues, throat clearing, migraines, and anxiety. The abdominal pain is located in the upper abdomen, is worse in the evening, and is exacerbated by lying on the left side and certain foods. The pain is not constant and has improved somewhat with the use of Dexilant, famotidine, and Zyrtec. The patient also reports the development of hives about two months ago, which are itchy and located all over the body. The hives have not improved with the use of Zyrtec and famotidine. The patient also experiences sinus issues and throat clearing on a regular basis. The patient suffers from migraines twice a month and daily headaches. The patient also reports anxiety, for which she is in therapy. The patient recently discontinued Zoloft due to side effects including sore nipples, disrupted sleep, and loss of sex drive. The patient also reports daily use of ibuprofen for pain management.  Patient hikes frequently has had tick exposure in the past, uncertain if meat products cause hives or worsened abdominal pain.     She  reports that she quit smoking about 8 years ago. Her smoking use included  cigarettes and e-cigarettes. She started smoking about 23 years ago. She has a 22.5 pack-year smoking history. She has never  used smokeless tobacco. She reports current alcohol use. She reports current drug use. Frequency: 7.00 times per week. Drugs: Marijuana and Psilocybin.  RELEVANT GI HISTORY, LABS, IMAGING:  EGD 12/12/2022  - The examined portions of the nasopharynx, oropharynx and larynx were normal. - Normal esophagus. - Normal stomach. Biopsied. - Normal examined duodenum. Biopsied. - No endoscopic abnormalities to explain patient' s symptoms. 1. Surgical [P], duodenum :       -  BENIGN DUODENAL MUCOSA WITH FOCAL INCREASED INTRAEPITHELIAL LYMPHOCYTES       -  NO ACUTE INFLAMMATION OR VILLOUS BLUNTING IDENTIFIED       -  SEE COMMENT       2. Surgical [P], gastric :       -  REACTIVE GASTROPATHY       -  NO H. PYLORI OR INTESTINAL METAPLASIA IDENTIFIED   CT Abdomen/Pelvis Nov 18, 2022 IMPRESSION: 1. No acute abnormality in the abdomen or pelvis. 2. Tiny fat containing paraumbilical hernia.   Barium Swallow Sept 2024 IMPRESSION: No abnormality seen in the esophagus.   CBC    Component Value Date/Time   WBC 4.3 01/10/2023 0833   RBC 4.67 01/10/2023 0833   HGB 14.1 01/10/2023 0833   HGB 15.0 08/19/2020 1258   HCT 41.7 01/10/2023 0833   HCT 45.4 08/19/2020 1258   PLT 380.0 01/10/2023 0833   PLT 336 08/19/2020 1258   MCV 89.4 01/10/2023 0833   MCV 90 08/19/2020 1258   MCH 29.9 08/19/2020 1258   MCHC 33.7 01/10/2023 0833   RDW 12.1 01/10/2023 0833   RDW 12.3 08/19/2020 1258   LYMPHSABS 1.5 01/10/2023 0833   LYMPHSABS 1.7 08/19/2020 1258   MONOABS 0.5 01/10/2023 0833   EOSABS 0.2 01/10/2023 0833   EOSABS 0.2 08/19/2020 1258   BASOSABS 0.0 01/10/2023 0833   BASOSABS 0.0 08/19/2020 1258   Recent Labs    05/25/22 1138 10/11/22 1154 01/10/23 0833  HGB 13.4 14.0 14.1    CMP     Component Value Date/Time   NA 140 01/10/2023 0833   NA 139 08/19/2020 1258   K 3.8 01/10/2023 0833   CL 104 01/10/2023 0833   CO2 29 01/10/2023 0833   GLUCOSE 82 01/10/2023 0833   BUN 11 01/10/2023 0833    BUN 9 08/19/2020 1258   CREATININE 0.80 01/10/2023 0833   CALCIUM 9.4 01/10/2023 0833   PROT 6.9 01/10/2023 0833   PROT 7.1 08/19/2020 1258   ALBUMIN 4.4 01/10/2023 0833   ALBUMIN 5.0 (H) 08/19/2020 1258   AST 16 01/10/2023 0833   ALT 11 01/10/2023 0833   ALKPHOS 65 01/10/2023 0833   BILITOT 0.5 01/10/2023 0833   BILITOT <0.2 08/19/2020 1258      Latest Ref Rng & Units 01/10/2023    8:33 AM 10/11/2022   11:54 AM 05/25/2022   11:38 AM  Hepatic Function  Total Protein 6.0 - 8.3 g/dL 6.9  7.2  7.1   Albumin 3.5 - 5.2 g/dL 4.4  4.4  4.5   AST 0 - 37 U/L 16  16  20    ALT 0 - 35 U/L 11  10  26    Alk Phosphatase 39 - 117 U/L 65  72  95   Total Bilirubin 0.2 - 1.2 mg/dL 0.5  0.7  0.8       Current Medications:     Current  Outpatient Medications (Respiratory):    cetirizine (ZYRTEC) 10 MG tablet, Take 1 tablet (10 mg total) by mouth at bedtime.    Current Outpatient Medications (Other):    dexlansoprazole (DEXILANT) 60 MG capsule, Take 1 capsule (60 mg total) by mouth daily.   famotidine (PEPCID) 20 MG tablet, Take 1 tablet (20 mg total) by mouth 2 (two) times daily.   sertraline (ZOLOFT) 50 MG tablet, Take 1 tablet (50 mg total) by mouth at bedtime. (Patient not taking: Reported on 03/29/2023)  Medical History:  Past Medical History:  Diagnosis Date   Allergies 11/22/2022   Annual physical exam 08/19/2020   Anxiety    Common cold 11/22/2022   increase po fluids, rest, tylenol prn, robitussin prn cough     Depression    Encounter for postpartum care of lactating mother 01/10/2023   Encounter for supervision of normal pregnancy in multigravida 01/10/2023   s=d; reassuring fwb. reviewed 2nd trim prec in detail.     Endometriosis    Essential hypertension 01/10/2023   GERD (gastroesophageal reflux disease)    Health examination of defined subpopulation 01/10/2023   Migraines    Need for prophylactic vaccination with combined vaccine 01/10/2023   Need for prophylactic  vaccination with measles-mumps-rubella (MMR) vaccine 01/10/2023   Person encountering health services to consult on behalf of another person 01/10/2023   Routine general medical examination at a health care facility 01/10/2023   Routine postpartum follow-up 01/10/2023   Routine postpartum follow-up 01/10/2023   Thyroid disease 2020   3mm nodule noted and monitored through [place]   Transient hypertension of pregnancy, antepartum 01/10/2023   Allergies: No Known Allergies   Surgical History:  She  has a past surgical history that includes Ablation and Hysteroscopy. Family History:  Her family history includes ADD / ADHD in her brother and son; Alcohol abuse in her father; Anxiety disorder in her daughter, daughter, and daughter; Arthritis in her father and mother; Bipolar disorder in her father and mother; Depression in her daughter, daughter, father, and mother; Heart disease in her paternal grandfather and paternal grandmother; Hyperlipidemia in her father and mother; Hypertension in her brother, father, paternal grandfather, and paternal grandmother; Learning disabilities in her brother; Miscarriages / [place] in her mother; Thyroid disease in her mother; Varicose Veins in her mother.  REVIEW OF SYSTEMS  : All other systems reviewed and negative except where noted in the History of Present Illness.  PHYSICAL EXAM: BP 116/82   Pulse 70   Ht 5\' 10"  (1.778 m)   Wt 175 lb 6 oz (79.5 kg)   LMP 02/25/2023 (Exact Date)   BMI 25.16 kg/m  General Appearance: Well nourished, in no apparent distress. Head:   Normocephalic and atraumatic. Eyes:  sclerae anicteric,conjunctive pink  Respiratory: Respiratory effort normal, BS equal bilaterally without rales, rhonchi, wheezing. Cardio: RRR with no MRGs. Peripheral pulses intact.  Abdomen: Soft,  Flat ,active bowel sounds.  Pinpoint left upper abdominal tenderness along rib cage, positive Carnett's sign  No masses. Rectal: Not  evaluated Musculoskeletal: Full ROM, Normal gait. Without edema. Skin:  Dry and intact without significant lesions or rashes Neuro: Alert and  oriented x4;  No focal deficits. Psych:  Cooperative. Normal mood and affect.    Doree Albee, PA-C 12:08 PM

## 2023-03-29 NOTE — Patient Instructions (Addendum)
Your provider has requested that you go to the basement level for lab work before leaving today. Press "B" on the elevator. The lab is located at the first door on the left as you exit the elevator.   No aleve, ibuprofen, goody powders, as these are antiinflammatories and can cause inflammation in your stomach, increase bleeding risk and cause ulcers.  You can talk with PCP about alternative pain options.  Can do tyelnol max 3000 mg a day, salon pas patches are over the counter and voltern gel is topical antiinflammatory that is safe.   If this does not help, suggest getting sports medicine for injection.   VISIT SUMMARY:  During today's visit, we discussed your ongoing issues with abdominal pain, hives, sinus problems, throat clearing, migraines, and anxiety. We reviewed your current treatments and made some adjustments to better manage your symptoms.  YOUR PLAN:  -ABDOMINAL PAIN: Your abdominal pain, which is worse in the evening and when lying on your left side, has shown some improvement with your current medications. This may be due to functional dyspepsia or musculoskeletal pain. We recommend continuing Dexilant and trying over-the-counter lidocaine patches. If the pain persists, we may refer you to sports medicine for further evaluation.  -URTICARIA: You have been experiencing itchy hives for the past two months that have not improved with your current medications. This could be due to mast cell activation syndrome or an alpha-gal allergy. We will order tests to check your tryptase levels and an alpha-gal panel to investigate further.  -MIGRAINES: You are experiencing frequent migraines and daily headaches, which may be worsened by daily ibuprofen use. Consider alternative pain management strategies to reduce your ibuprofen use. Can try betablocker, topamax, amitripyline, nurtec, or injectables  -GASTROESOPHAGEAL REFLUX DISEASE (GERD): Your GERD symptoms have improved with Dexilant,  Pepcid, and Zyrtec. We recommend continuing these medications to manage your symptoms.  -GENERAL HEALTH MAINTENANCE: We will continue with your daily fiber supplement and check your labs for tryptase and the alpha-gal panel. Follow-up as needed for your abdominal pain and hives.  INSTRUCTIONS:  Please follow up as needed for your abdominal pain and hives. We will also refer you to a specialist for migraine management and consider alternative pain management strategies. Continue with your current medications and therapy, and we will review your lab results once they are available.  First do a trial off milk/lactose products if you use them.  Add fiber like benefiber or citracel once a day Increase activity Can do trial of IBGard which is over the counter for AB pain- Take 1-2 capsules once a day for maintence or twice a day during a flare    FODMAP stands for fermentable oligo-, di-, mono-saccharides and polyols (1). These are the scientific terms used to classify groups of carbs that are difficult for our body to digest and that are notorious for triggering digestive symptoms like bloating, gas, loose stools and stomach pain.   You can try low FODMAP diet  - start with eliminating just one column at a time that you feel may be a trigger for you. - the table at the very bottom contains foods that are low in FODMAPs   Sometimes trying to eliminate the FODMAP's from your diet is difficult or tricky, if you are stuggling with trying to do the elimination diet you can try an enzyme.  There is a food enzymes that you sprinkle in or on your food that helps break down the FODMAP. You can read more about  the enzyme by going to this site: https://fodzyme.com/   _______________________________________________________  If your blood pressure at your visit was 140/90 or greater, please contact your primary care physician to follow up on  this.  _______________________________________________________  If you are age 10 or older, your body mass index should be between 23-30. Your Body mass index is 25.16 kg/m. If this is out of the aforementioned range listed, please consider follow up with your Primary Care Provider.  If you are age 61 or younger, your body mass index should be between 19-25. Your Body mass index is 25.16 kg/m. If this is out of the aformentioned range listed, please consider follow up with your Primary Care Provider.   ________________________________________________________  The  GI providers would like to encourage you to use [facility] to communicate with providers for non-urgent requests or questions.  Due to long hold times on the telephone, sending your provider a message by [facility] may be a faster and more efficient way to get a response.  Please allow 48 business hours for a response.  Please remember that this is for non-urgent requests.  _______________________________________________________ Thank you for trusting me with your gastrointestinal care!   Quentin Mulling, PA

## 2023-03-30 NOTE — Progress Notes (Signed)
Agree with the assessment and plan as outlined by Quentin Mulling,  PA-C.  Agree with ruling alpha gal and mastocytosis, although unlikely.  Also consider empiric dietary changes such as low FODMAP diet.  Trial of TCA or busipirone for functional dyspepsia could also be considered with [facility] approval  Hagan Vanauken E. Tomasa Rand, MD

## 2023-04-01 ENCOUNTER — Other Ambulatory Visit: Payer: Self-pay | Admitting: Physician Assistant

## 2023-04-01 ENCOUNTER — Other Ambulatory Visit: Payer: Self-pay

## 2023-04-01 DIAGNOSIS — L509 Urticaria, unspecified: Secondary | ICD-10-CM

## 2023-04-01 DIAGNOSIS — R1013 Epigastric pain: Secondary | ICD-10-CM

## 2023-04-02 ENCOUNTER — Other Ambulatory Visit (HOSPITAL_COMMUNITY): Payer: Self-pay

## 2023-04-02 LAB — ALPHA-GAL PANEL
Allergen, Mutton, f88: <0.1 kU/L
Allergen, Pork, f26: <0.1 kU/L
Beef: <0.1 kU/L
CLASS: 0
CLASS: 0
Class: 0
GALACTOSE-ALPHA-1,3-GALACTOSE IGE*: <0.1 kU/L (ref <0.10)

## 2023-04-02 LAB — TRYPTASE: Tryptase: 7.4 ug/L (ref <11.0)

## 2023-04-02 LAB — INTERPRETATION:

## 2023-04-02 MED ORDER — FAMOTIDINE 20 MG PO TABS
20.0000 mg | ORAL_TABLET | Freq: Two times a day (BID) | ORAL | 0 refills | Status: DC
  Filled 2023-04-02: qty 60, 30d supply, fill #0

## 2023-04-02 MED ORDER — DEXLANSOPRAZOLE 60 MG PO CPDR
60.0000 mg | DELAYED_RELEASE_CAPSULE | Freq: Every day | ORAL | 0 refills | Status: DC
  Filled 2023-04-02: qty 30, 30d supply, fill #0

## 2023-04-03 ENCOUNTER — Ambulatory Visit: Payer: BC Managed Care – PPO | Admitting: Behavioral Health

## 2023-04-03 DIAGNOSIS — F331 Major depressive disorder, recurrent, moderate: Secondary | ICD-10-CM | POA: Diagnosis not present

## 2023-04-03 DIAGNOSIS — F4312 Post-traumatic stress disorder, chronic: Secondary | ICD-10-CM

## 2023-04-03 DIAGNOSIS — F411 Generalized anxiety disorder: Secondary | ICD-10-CM

## 2023-04-03 NOTE — Progress Notes (Signed)
   Deneise Lever, LMFT

## 2023-04-03 NOTE — Progress Notes (Signed)
 [facility] Counselor/Therapist Progress Note  Patient ID: Valerie Kim, MRN: [id number],    Date: 04/03/2023  Time Spent: 55 min In Person @[facility]  - [facility] Office Time In: 9:00am Time Out: 9:55am   Treatment Type: Individual Therapy  Reported Symptoms: Reduction in anx/dep & stress due to recent trip w/Grp of Women who are supportive & engaged in the [place] Grp. She found this new exp of friendship very different than her exp's in the past.   Mental Status Exam: Appearance:  Casual     Behavior: Appropriate and Sharing  Motor: Normal  Speech/Language:  Clear and Coherent  Affect: Congruent  Mood: anxious  Thought process: normal  Thought content:   WNL  Sensory/Perceptual disturbances:   WNL  Orientation: oriented to person, place, time/date, and situation  Attention: Good  Concentration: Good  Memory: WNL  Fund of knowledge:  Good  Insight:   Fair  Judgment:  Good  Impulse Control: Fair   Risk Assessment: Danger to Self:  No Self-injurious Behavior: No Danger to Others: No Duty to Warn:no Physical Aggression / Violence:No  Access to Firearms a concern: No  Gang Involvement:No   Subjective: Pt relates a positive exp of a Grp of 6 women who gathered in a cabin in [place] over last wknd to talk & nurture friendships. Pt felt like the atmosphere was accepting & gracious to her as a woman & she has never exp'd friendships like this before in her lifetime. She appreciates ea of the women who were present & her anticipatory anxiety was not justified.   [employer] is taking shape & these same 6 women have a part in this happening. She also plays D & D once wkly w/this same Grp. She contrasted this to a friend she has known since she was 12yo. This friend is very neg, yet will not move through her issues to reach a better place. She feels this relationship will take less of her time in the future.   Children seem to be doing well. Her first child Lesly Rubenstein is an  anxious sort. She needs everything to be just right. This is hard on her Dtr. Her child Jax knows how to be Trans safely & expresses the ideas he holds w/out reservation. 17yo Son Fayrene Fearing is having some issues which she is alert to, & seems to be doing okay.  Pt can tell the difference in the current culture of "blocking & cut-offs". She sees the reasoning behind this & it resonates w/her she has cut ties to her Parents.   Interventions: Family Systems and Interpersonal  Diagnosis:GAD (generalized anxiety disorder)  Moderate episode of recurrent major depressive disorder (HCC)  Chronic post-traumatic stress disorder (PTSD)  Plan: Abeeha was encouraged today to use her Notebook btwn sessions so she will remember & take time to process her thoughts/feelings & anxieties as she recognizes them in the present moment. She agrees this may help her use psychotherapy to its fullest.   Target Date: 04/21/2023  Progress: 5  Frequency: Once every 2-3 wks  Modality: Claretta Fraise, LMFT

## 2023-04-07 DIAGNOSIS — Z419 Encounter for procedure for purposes other than remedying health state, unspecified: Secondary | ICD-10-CM | POA: Diagnosis not present

## 2023-04-11 ENCOUNTER — Encounter: Payer: Self-pay | Admitting: Physician Assistant

## 2023-04-11 ENCOUNTER — Other Ambulatory Visit (HOSPITAL_COMMUNITY): Payer: Self-pay

## 2023-04-11 ENCOUNTER — Ambulatory Visit: Payer: BC Managed Care – PPO | Admitting: Physician Assistant

## 2023-04-11 VITALS — BP 112/78 | HR 82 | Temp 97.8°F | Ht 70.0 in | Wt 172.8 lb

## 2023-04-11 DIAGNOSIS — G43009 Migraine without aura, not intractable, without status migrainosus: Secondary | ICD-10-CM | POA: Diagnosis not present

## 2023-04-11 DIAGNOSIS — L509 Urticaria, unspecified: Secondary | ICD-10-CM | POA: Diagnosis not present

## 2023-04-11 MED ORDER — AMITRIPTYLINE HCL 25 MG PO TABS
25.0000 mg | ORAL_TABLET | Freq: Every day | ORAL | 2 refills | Status: DC
  Filled 2023-04-11: qty 30, 30d supply, fill #0
  Filled 2023-04-29 – 2023-05-04 (×2): qty 30, 30d supply, fill #1
  Filled 2023-05-29: qty 30, 30d supply, fill #2

## 2023-04-11 NOTE — Progress Notes (Signed)
 Patient ID: Valerie Kim, female    DOB: 30-Oct-1985, 38 y.o.   MRN: [id number]   Assessment & Plan:  Migraine without aura and without status migrainosus, not intractable -     Amitriptyline HCl; Take 1 tablet (25 mg total) by mouth at bedtime.  Dispense: 30 tablet; Refill: 2  Urticaria -     Ambulatory referral to Allergy   Assessment and Plan    Migraine Longstanding history of migraines since age 4, with increased frequency and severity recently. Migraines are associated with photophobia, phonophobia, and osmophobia. No prior prophylactic treatment. -Start Amitriptyline at bedtime for migraine prevention and potential anxiolytic benefits. -Provide samples of Nurtec for acute migraine treatment.  Chronic Urticaria Persistent hives for over 8 weeks, not responsive to famotidine and Zyrtec. No clear trigger identified. -Consider referral to Allergy for further evaluation and management.  Anxiety Ongoing stress related to personal life events. Currently under the care of a psychologist. -Continue psychological therapy. -Consider potential anxiolytic benefits of Amitriptyline.   Return in about 6 weeks (around 05/23/2023) for recheck/follow-up.    Subjective:    Chief Complaint  Patient presents with   Migraine    Pt c.o of migraines, has had these for years, occurs 2 times a month, takes Advil and Excedrin, concerned taking too many. Pt has nausea with migraines.     HPI Discussed the use of AI scribe software for clinical note transcription with the patient, who gave verbal consent to proceed.  History of Present Illness   Valerie Kim is a 38 year old female who presents with migraines and hives. She was referred by her GI doctor due to concerns about Advil intake and underlying migraine causes.  She has experienced migraines since age 70, with recent worsening in frequency and severity. Symptoms include shoulder lock-up, arm pain, and occasional chest pain.  Migraines typically start at the base of her skull or the middle top of her head, associated with photophobia, phonophobia, osmophobia, and blurry vision. She has been taking Advil daily, sometimes multiple times a day, but stopped for the past two weeks, resulting in fewer daily headaches. She experiences about two migraines per month, which vary in frequency. She has a history of taking a sublingual medication for migraines early on, which was ineffective. She has not been prescribed any preventive medication for migraines. Her migraines require her to lay down and sleep for relief.  She experiences hives daily for over eight weeks, primarily on her shoulders, chest, and back, exacerbated by heat. She has a history of bed bugs but has treated her home and pets and is diligent in checking for pests. The hives leave dark spots as they resolve and new ones appear. She has been taking famotidine and Zyrtec, which have helped with sinus issues but not with the hives.  She experienced adverse effects from Zoloft, including sleep disturbances and nipple pain, leading her to discontinue it. These symptoms resolved after discontinuation of the medication.  She continues to experience sleep issues, waking up multiple times a night and feeling exhausted during the day.       Past Medical History:  Diagnosis Date   Allergies 11/22/2022   Annual physical exam 08/19/2020   Anxiety    Common cold 11/22/2022   increase po fluids, rest, tylenol prn, robitussin prn cough     Depression    Encounter for postpartum care of lactating mother 01/10/2023   Encounter for supervision of normal pregnancy in multigravida 01/10/2023  s=d; reassuring fwb. reviewed 2nd trim prec in detail.     Endometriosis    Essential hypertension 01/10/2023   GERD (gastroesophageal reflux disease)    Health examination of defined subpopulation 01/10/2023   Migraines    Need for prophylactic vaccination with combined vaccine  01/10/2023   Need for prophylactic vaccination with measles-mumps-rubella (MMR) vaccine 01/10/2023   Person encountering health services to consult on behalf of another person 01/10/2023   Routine general medical examination at a health care facility 01/10/2023   Routine postpartum follow-up 01/10/2023   Routine postpartum follow-up 01/10/2023   Thyroid disease 2020   3mm nodule noted and monitored through [place]   Transient hypertension of pregnancy, antepartum 01/10/2023    Past Surgical History:  Procedure Laterality Date   ABLATION     cardiac   HYSTEROSCOPY      Family History  Problem Relation Age of Onset   Hyperlipidemia Mother    Thyroid disease Mother    Depression Mother    Bipolar disorder Mother    Arthritis Mother    Miscarriages / [place] Mother    Varicose Veins Mother    Hyperlipidemia Father    Alcohol abuse Father    Depression Father    Bipolar disorder Father    Arthritis Father    Hypertension Father    ADD / ADHD Brother    Hypertension Brother    Learning disabilities Brother    Hypertension Paternal Grandmother    Heart disease Paternal Grandmother    Heart disease Paternal Grandfather    Hypertension Paternal Grandfather    Anxiety disorder Daughter    Depression Daughter    Anxiety disorder Daughter    Depression Daughter    ADD / ADHD Son    Anxiety disorder Daughter    Colon cancer Neg Hx    Esophageal cancer Neg Hx    Rectal cancer Neg Hx    Stomach cancer Neg Hx     Social History   Tobacco Use   Smoking status: Former    Current packs/day: 0.00    Average packs/day: 1.5 packs/day for 15.0 years (22.5 ttl pk-yrs)    Types: Cigarettes, E-cigarettes    Start date: 02/07/2000    Quit date: 02/07/2015    Years since quitting: 8.1   Smokeless tobacco: Never  Vaping Use   Vaping status: Every Day   Substances: Nicotine  Substance Use Topics   Alcohol use: Yes    Comment: Occassional social 1-2 drinks   Drug use: Yes     Frequency: 7.0 times per week    Types: Marijuana, Psilocybin     No Known Allergies  Review of Systems NEGATIVE UNLESS OTHERWISE INDICATED IN HPI      Objective:     BP 112/78   Pulse 82   Temp 97.8 F (36.6 C)   Ht 5\' 10"  (1.778 m)   Wt 172 lb 12.8 oz (78.4 kg)   LMP 03/23/2023   SpO2 100%   BMI 24.79 kg/m   Wt Readings from Last 3 Encounters:  04/11/23 172 lb 12.8 oz (78.4 kg)  03/29/23 175 lb 6 oz (79.5 kg)  03/07/23 167 lb 3.2 oz (75.8 kg)    BP Readings from Last 3 Encounters:  04/11/23 112/78  03/29/23 116/82  03/07/23 112/78     Physical Exam Vitals and nursing note reviewed.  Constitutional:      Appearance: Normal appearance.  Cardiovascular:     Rate and Rhythm: Normal rate and regular  rhythm.  Pulmonary:     Effort: Pulmonary effort is normal.     Breath sounds: Normal breath sounds.  Skin:    Findings: Rash (scattered diffuse, urticarial lesions - a few on L shoulder and cheeks today) present.  Neurological:     General: No focal deficit present.     Mental Status: She is alert and oriented to person, place, and time.  Psychiatric:        Mood and Affect: Mood normal.        Behavior: Behavior normal.        Mitsugi Schrader M Toya Palacios, PA-C

## 2023-04-26 ENCOUNTER — Ambulatory Visit: Payer: BC Managed Care – PPO | Admitting: Behavioral Health

## 2023-04-26 DIAGNOSIS — F331 Major depressive disorder, recurrent, moderate: Secondary | ICD-10-CM

## 2023-04-26 DIAGNOSIS — F411 Generalized anxiety disorder: Secondary | ICD-10-CM | POA: Diagnosis not present

## 2023-04-26 NOTE — Progress Notes (Signed)
 [facility] Counselor/Therapist Progress Note  Patient ID: Valerie Kim, MRN: [id number],    Date: 08/23/2023  Time Spent: 50 min In Person @ [facility] Office Time In: 9:00am Time Out: 9:50am  Treatment Type: Individual Therapy  Reported Symptoms: Elevated anx/dep & familial stressors  Mental Status Exam: Appearance:  Casual     Behavior: Appropriate and Sharing  Motor: Normal; Pt will knit or crochet during the visit  Speech/Language:  Clear and Coherent  Affect: Appropriate  Mood: anxious  Thought process: flight of ideas  Thought content:   WNL  Sensory/Perceptual disturbances:   Prefers low lighting in the Office  Orientation: oriented to person, place, time/date, and situation  Attention: Good  Concentration: Good  Memory: WNL  Fund of knowledge:  Good  Insight:   Fair  Judgment:  Good  Impulse Control: Good   Risk Assessment: Danger to Self:  No Self-injurious Behavior: No Danger to Others: No Duty to Warn:no Physical Aggression / Violence:No  Access to Firearms a concern: No  Gang Involvement:No   Subjective: Pt is maintaining well & coping through Family difficulties such as bed bugs & her Px rxn w/hives. Pt has migraines & a new rescue Rx through PCP. Pt is not compliant w/her medications, taking Wellbutrin  script half the time. Her Elavil  seems to have more impact.   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 She does not want to have any repurcussions in the Court Syst. She is also minding her Husb's health needs through the [employer] who has exp'd a reduction in healthcare workers due to firings. Pt is concerned for the healthcare coverage changes that may happen due to the new Administration.   Interventions: Insight-Oriented and Family Systems  Diagnosis:GAD (generalized anxiety disorder)  Moderate episode of recurrent major depressive disorder (HCC)  Plan: Adiva will be more consistent w/her Psychotherapy visits since her  realization about needing them. She will track her progress in a Notebook again to have a better barometer of how she is managing. She has come to the realization that a new home will take another year. Her acceptance of this is just a fact & not too upsetting. She now has a year to plan & feel prepared. Latoia will cont her efforts & use the Notebook to record her successes again.  Target Date: 05/22/2023  Progress: 6  Frequency: Once every 2-3 wks   Modality: Kennis Richerd LITTIE Hollace, LMFT

## 2023-04-26 NOTE — Progress Notes (Signed)
   Deneise Lever, LMFT

## 2023-04-29 ENCOUNTER — Other Ambulatory Visit: Payer: Self-pay | Admitting: Physician Assistant

## 2023-04-29 DIAGNOSIS — R1013 Epigastric pain: Secondary | ICD-10-CM

## 2023-04-29 DIAGNOSIS — L509 Urticaria, unspecified: Secondary | ICD-10-CM

## 2023-04-30 ENCOUNTER — Other Ambulatory Visit: Payer: Self-pay

## 2023-04-30 ENCOUNTER — Other Ambulatory Visit (HOSPITAL_COMMUNITY): Payer: Self-pay

## 2023-04-30 MED ORDER — DEXLANSOPRAZOLE 60 MG PO CPDR
60.0000 mg | DELAYED_RELEASE_CAPSULE | Freq: Every day | ORAL | 0 refills | Status: DC
  Filled 2023-04-30: qty 30, 30d supply, fill #0

## 2023-04-30 MED ORDER — FAMOTIDINE 20 MG PO TABS
20.0000 mg | ORAL_TABLET | Freq: Two times a day (BID) | ORAL | 0 refills | Status: DC
  Filled 2023-04-30: qty 60, 30d supply, fill #0

## 2023-05-15 ENCOUNTER — Ambulatory Visit (INDEPENDENT_AMBULATORY_CARE_PROVIDER_SITE_OTHER): Admitting: Behavioral Health

## 2023-05-15 DIAGNOSIS — F331 Major depressive disorder, recurrent, moderate: Secondary | ICD-10-CM

## 2023-05-15 DIAGNOSIS — F411 Generalized anxiety disorder: Secondary | ICD-10-CM | POA: Diagnosis not present
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
## 2023-05-15 NOTE — Progress Notes (Signed)
   Deneise Lever, LMFT

## 2023-05-19 DIAGNOSIS — Z419 Encounter for procedure for purposes other than remedying health state, unspecified: Secondary | ICD-10-CM | POA: Diagnosis not present

## 2023-05-29 ENCOUNTER — Other Ambulatory Visit (HOSPITAL_COMMUNITY): Payer: Self-pay

## 2023-05-29 ENCOUNTER — Other Ambulatory Visit: Payer: Self-pay

## 2023-05-29 ENCOUNTER — Ambulatory Visit: Payer: BC Managed Care – PPO | Admitting: Physician Assistant

## 2023-05-29 ENCOUNTER — Other Ambulatory Visit: Payer: Self-pay | Admitting: Physician Assistant

## 2023-05-29 DIAGNOSIS — L509 Urticaria, unspecified: Secondary | ICD-10-CM

## 2023-05-29 DIAGNOSIS — R1013 Epigastric pain: Secondary | ICD-10-CM

## 2023-05-29 MED ORDER — CETIRIZINE HCL 10 MG PO TABS
10.0000 mg | ORAL_TABLET | Freq: Every day | ORAL | 2 refills | Status: DC
  Filled 2023-05-29: qty 30, 30d supply, fill #0
  Filled 2023-06-29: qty 30, 30d supply, fill #1
  Filled 2023-07-30: qty 30, 30d supply, fill #2

## 2023-05-29 MED ORDER — FAMOTIDINE 20 MG PO TABS
20.0000 mg | ORAL_TABLET | Freq: Two times a day (BID) | ORAL | 0 refills | Status: DC
  Filled 2023-05-29: qty 60, 30d supply, fill #0

## 2023-05-29 MED ORDER — DEXLANSOPRAZOLE 60 MG PO CPDR
60.0000 mg | DELAYED_RELEASE_CAPSULE | Freq: Every day | ORAL | 0 refills | Status: DC
  Filled 2023-05-29: qty 30, 30d supply, fill #0

## 2023-06-06 ENCOUNTER — Ambulatory Visit: Admitting: Behavioral Health

## 2023-06-18 ENCOUNTER — Other Ambulatory Visit (HOSPITAL_COMMUNITY): Payer: Self-pay

## 2023-06-18 DIAGNOSIS — F3281 Premenstrual dysphoric disorder: Secondary | ICD-10-CM | POA: Diagnosis not present

## 2023-06-18 DIAGNOSIS — N946 Dysmenorrhea, unspecified: Secondary | ICD-10-CM | POA: Diagnosis not present

## 2023-06-18 DIAGNOSIS — Z419 Encounter for procedure for purposes other than remedying health state, unspecified: Secondary | ICD-10-CM | POA: Diagnosis not present

## 2023-06-18 DIAGNOSIS — N920 Excessive and frequent menstruation with regular cycle: Secondary | ICD-10-CM | POA: Diagnosis not present

## 2023-06-18 DIAGNOSIS — N898 Other specified noninflammatory disorders of vagina: Secondary | ICD-10-CM | POA: Diagnosis not present

## 2023-06-18 MED ORDER — BUPROPION HCL ER (SR) 150 MG PO TB12
150.0000 mg | ORAL_TABLET | Freq: Every day | ORAL | 1 refills | Status: DC
  Filled 2023-06-18: qty 60, 30d supply, fill #0

## 2023-06-26 ENCOUNTER — Ambulatory Visit: Admitting: Behavioral Health

## 2023-06-29 ENCOUNTER — Other Ambulatory Visit: Payer: Self-pay

## 2023-06-29 ENCOUNTER — Other Ambulatory Visit: Payer: Self-pay | Admitting: Physician Assistant

## 2023-06-29 ENCOUNTER — Other Ambulatory Visit (HOSPITAL_COMMUNITY): Payer: Self-pay

## 2023-06-29 DIAGNOSIS — G43009 Migraine without aura, not intractable, without status migrainosus: Secondary | ICD-10-CM

## 2023-06-29 DIAGNOSIS — R1013 Epigastric pain: Secondary | ICD-10-CM

## 2023-06-29 DIAGNOSIS — L509 Urticaria, unspecified: Secondary | ICD-10-CM

## 2023-06-29 MED ORDER — AMITRIPTYLINE HCL 25 MG PO TABS
25.0000 mg | ORAL_TABLET | Freq: Every day | ORAL | 2 refills | Status: DC
  Filled 2023-06-29: qty 30, 30d supply, fill #0
  Filled 2023-07-30: qty 30, 30d supply, fill #1
  Filled 2023-08-29: qty 30, 30d supply, fill #2

## 2023-06-29 MED ORDER — DEXLANSOPRAZOLE 60 MG PO CPDR
60.0000 mg | DELAYED_RELEASE_CAPSULE | Freq: Every day | ORAL | 0 refills | Status: DC
  Filled 2023-06-29: qty 30, 30d supply, fill #0

## 2023-06-29 MED ORDER — FAMOTIDINE 20 MG PO TABS
20.0000 mg | ORAL_TABLET | Freq: Two times a day (BID) | ORAL | 0 refills | Status: DC
  Filled 2023-06-29: qty 60, 30d supply, fill #0

## 2023-07-19 DIAGNOSIS — Z419 Encounter for procedure for purposes other than remedying health state, unspecified: Secondary | ICD-10-CM | POA: Diagnosis not present

## 2023-07-30 ENCOUNTER — Other Ambulatory Visit: Payer: Self-pay

## 2023-07-30 ENCOUNTER — Other Ambulatory Visit: Payer: Self-pay | Admitting: Physician Assistant

## 2023-07-30 ENCOUNTER — Other Ambulatory Visit (HOSPITAL_COMMUNITY): Payer: Self-pay

## 2023-07-30 DIAGNOSIS — R1013 Epigastric pain: Secondary | ICD-10-CM

## 2023-07-30 MED ORDER — DEXLANSOPRAZOLE 60 MG PO CPDR
60.0000 mg | DELAYED_RELEASE_CAPSULE | Freq: Every day | ORAL | 0 refills | Status: DC
Start: 2023-07-30 — End: 2023-08-29
  Filled 2023-07-30: qty 30, 30d supply, fill #0

## 2023-08-18 DIAGNOSIS — Z419 Encounter for procedure for purposes other than remedying health state, unspecified: Secondary | ICD-10-CM | POA: Diagnosis not present

## 2023-08-29 ENCOUNTER — Other Ambulatory Visit (HOSPITAL_COMMUNITY): Payer: Self-pay

## 2023-08-29 ENCOUNTER — Other Ambulatory Visit: Payer: Self-pay

## 2023-08-29 ENCOUNTER — Other Ambulatory Visit: Payer: Self-pay | Admitting: Physician Assistant

## 2023-08-29 DIAGNOSIS — R1013 Epigastric pain: Secondary | ICD-10-CM

## 2023-08-29 DIAGNOSIS — L509 Urticaria, unspecified: Secondary | ICD-10-CM

## 2023-08-29 MED ORDER — FAMOTIDINE 20 MG PO TABS
20.0000 mg | ORAL_TABLET | Freq: Two times a day (BID) | ORAL | 0 refills | Status: DC
Start: 2023-08-29 — End: 2023-11-04
  Filled 2023-08-29: qty 60, 30d supply, fill #0

## 2023-08-29 MED ORDER — DEXLANSOPRAZOLE 60 MG PO CPDR
60.0000 mg | DELAYED_RELEASE_CAPSULE | Freq: Every day | ORAL | 0 refills | Status: DC
  Filled 2023-08-29: qty 30, 30d supply, fill #0

## 2023-09-18 DIAGNOSIS — Z419 Encounter for procedure for purposes other than remedying health state, unspecified: Secondary | ICD-10-CM | POA: Diagnosis not present

## 2023-09-26 ENCOUNTER — Other Ambulatory Visit: Payer: Self-pay | Admitting: Physician Assistant

## 2023-09-26 ENCOUNTER — Other Ambulatory Visit (HOSPITAL_COMMUNITY): Payer: Self-pay

## 2023-09-26 DIAGNOSIS — L509 Urticaria, unspecified: Secondary | ICD-10-CM

## 2023-09-26 DIAGNOSIS — R1013 Epigastric pain: Secondary | ICD-10-CM

## 2023-09-26 DIAGNOSIS — G43009 Migraine without aura, not intractable, without status migrainosus: Secondary | ICD-10-CM

## 2023-09-26 MED ORDER — AMITRIPTYLINE HCL 25 MG PO TABS
25.0000 mg | ORAL_TABLET | Freq: Every day | ORAL | 2 refills | Status: DC
  Filled 2023-09-26: qty 30, 30d supply, fill #0
  Filled 2023-10-30: qty 30, 30d supply, fill #1
  Filled 2023-11-30: qty 30, 30d supply, fill #2

## 2023-09-26 MED ORDER — CETIRIZINE HCL 10 MG PO TABS
10.0000 mg | ORAL_TABLET | Freq: Every day | ORAL | 2 refills | Status: DC
  Filled 2023-09-26: qty 30, 30d supply, fill #0
  Filled 2023-10-30: qty 30, 30d supply, fill #1
  Filled 2023-11-30: qty 30, 30d supply, fill #2

## 2023-09-26 MED ORDER — DEXLANSOPRAZOLE 60 MG PO CPDR
60.0000 mg | DELAYED_RELEASE_CAPSULE | Freq: Every day | ORAL | 0 refills | Status: DC
  Filled 2023-09-26: qty 30, 30d supply, fill #0

## 2023-10-09 ENCOUNTER — Other Ambulatory Visit (HOSPITAL_COMMUNITY): Payer: Self-pay

## 2023-10-09 MED ORDER — TRETINOIN 0.025 % EX CREA
1.0000 | TOPICAL_CREAM | Freq: Every evening | CUTANEOUS | 2 refills | Status: DC
  Filled 2023-10-09: qty 45, 30d supply, fill #0

## 2023-10-19 DIAGNOSIS — Z419 Encounter for procedure for purposes other than remedying health state, unspecified: Secondary | ICD-10-CM | POA: Diagnosis not present

## 2023-10-30 ENCOUNTER — Other Ambulatory Visit: Payer: Self-pay | Admitting: Physician Assistant

## 2023-10-30 ENCOUNTER — Other Ambulatory Visit: Payer: Self-pay

## 2023-10-30 ENCOUNTER — Other Ambulatory Visit (HOSPITAL_COMMUNITY): Payer: Self-pay

## 2023-10-30 DIAGNOSIS — R1013 Epigastric pain: Secondary | ICD-10-CM

## 2023-10-30 MED ORDER — DEXLANSOPRAZOLE 60 MG PO CPDR
60.0000 mg | DELAYED_RELEASE_CAPSULE | Freq: Every day | ORAL | 0 refills | Status: DC
  Filled 2023-10-30: qty 30, 30d supply, fill #0

## 2023-11-04 ENCOUNTER — Other Ambulatory Visit: Payer: Self-pay

## 2023-11-04 ENCOUNTER — Encounter (HOSPITAL_BASED_OUTPATIENT_CLINIC_OR_DEPARTMENT_OTHER): Payer: Self-pay | Admitting: Emergency Medicine

## 2023-11-04 ENCOUNTER — Emergency Department (HOSPITAL_BASED_OUTPATIENT_CLINIC_OR_DEPARTMENT_OTHER)
Admission: EM | Admit: 2023-11-04 | Discharge: 2023-11-04 | Disposition: A | Discharge status: Home / Self Care | Attending: Emergency Medicine | Admitting: Emergency Medicine

## 2023-11-04 DIAGNOSIS — R21 Rash and other nonspecific skin eruption: Secondary | ICD-10-CM | POA: Insufficient documentation

## 2023-11-04 MED ORDER — CEPHALEXIN 500 MG PO CAPS
1000.0000 mg | ORAL_CAPSULE | Freq: Two times a day (BID) | ORAL | 0 refills | Status: DC
  Filled 2023-11-04: qty 20, 5d supply, fill #0

## 2023-11-04 MED ORDER — ITRACONAZOLE 100 MG PO CAPS
100.0000 mg | ORAL_CAPSULE | Freq: Every day | ORAL | 0 refills | Status: AC
  Filled 2023-11-04: qty 30, 30d supply, fill #0

## 2023-11-04 NOTE — ED Triage Notes (Signed)
 Pt c/o itchy, burning rash to posterior left hand. She states she was gardening last week and got several thorns stuck in her hand in the affected area; she removed the thorns but the rash developed afterwards and has gotten progressively worsened. Denies exposure to poison ivy/etc.

## 2023-11-04 NOTE — ED Provider Notes (Signed)
  [place] [facility] AT [facility] Provider Note   CSN: [id number] Arrival date & time: 11/04/23  1550     Patient presents with: Rash   Valerie Kim is a 38 y.o. female.  {Add pertinent medical, surgical, social history, OB history to HPI:32947}  Rash         Prior to Admission medications   Medication Sig Start Date End Date Taking? Authorizing Provider  amitriptyline  (ELAVIL ) 25 MG tablet Take 1 tablet (25 mg total) by mouth at bedtime. 09/26/23   Allwardt, Alyssa M, PA-C  buPROPion  (WELLBUTRIN  SR) 150 MG 12 hr tablet Take 1 tablet (150 mg total) by mouth daily for 1 week, then 2 times daily. 06/18/23     cetirizine  (ZYRTEC ) 10 MG tablet Take 1 tablet (10 mg total) by mouth at bedtime. 09/26/23   Allwardt, Alyssa M, PA-C  dexlansoprazole  (DEXILANT ) 60 MG capsule Take 1 capsule (60 mg total) by mouth daily. 10/30/23   Allwardt, Alyssa M, PA-C  famotidine  (PEPCID ) 20 MG tablet Take 1 tablet (20 mg total) by mouth 2 (two) times daily. 08/29/23   Allwardt, Alyssa M, PA-C  tretinoin  (RETIN-A ) 0.025 % cream Apply 1 Application topically every evening. 10/09/23       Allergies: Patient has no known allergies.    Review of Systems  Skin:  Positive for rash.    Updated Vital Signs BP 131/86 (BP Location: Right Arm)   Pulse 77   Temp 98.6 F (37 C) (Oral)   Resp 16   Ht 5' 10 (1.778 m)   Wt 94.3 kg   LMP 10/26/2023 (Exact Date)   SpO2 92%   BMI 29.84 kg/m   Physical Exam  (all labs ordered are listed, but only abnormal results are displayed) Labs Reviewed - No data to display  EKG: None  Radiology: No results found.  {Document cardiac monitor, telemetry assessment procedure when appropriate:32947} Procedures   Medications Ordered in the ED - No data to display    {Click here for ABCD2, HEART and other calculators REFRESH Note before signing:1}                              Medical Decision Making  ***  {Document critical care time  when appropriate  Document review of labs and clinical decision tools ie CHADS2VASC2, etc  Document your independent review of radiology images and any outside records  Document your discussion with family members, caretakers and with consultants  Document social determinants of health affecting pt's care  Document your decision making why or why not admission, treatments were needed:32947:::1}   Final diagnoses:  None    ED Discharge Orders     None

## 2023-11-04 NOTE — Discharge Instructions (Addendum)
 Please follow-up with your primary care doctor ASAP.  I have written you prescription for Keflex please take this for 3 full days and take pictures of your rash every day.  If you feel that you are rash is spreading up your arm please initiate the itraconazole and if you develop any fevers or worsening rash I do recommend that you return to the emergency room for reevaluation.  Otherwise please see your PCP.  If you are taking both Pepcid  and Dexilant  please discontinue the Pepcid  before you start taking itraconazole.

## 2023-11-05 ENCOUNTER — Other Ambulatory Visit: Payer: Self-pay

## 2023-11-05 ENCOUNTER — Other Ambulatory Visit (HOSPITAL_COMMUNITY): Payer: Self-pay

## 2023-11-05 ENCOUNTER — Ambulatory Visit (INDEPENDENT_AMBULATORY_CARE_PROVIDER_SITE_OTHER): Admitting: Family Medicine

## 2023-11-05 ENCOUNTER — Encounter: Payer: Self-pay | Admitting: Family Medicine

## 2023-11-05 VITALS — BP 123/78 | HR 67 | Temp 97.8°F | Ht 70.0 in | Wt 211.0 lb

## 2023-11-05 DIAGNOSIS — B429 Sporotrichosis, unspecified: Secondary | ICD-10-CM

## 2023-11-05 NOTE — Progress Notes (Signed)
 Subjective  CC:  Chief Complaint  Patient presents with   Hospitalization Follow-up   Rash   Same day acute visit; PCP not available. New pt to me. Chart reviewed.   HPI: Valerie Kim is a 38 y.o. female who presents to the office today to address the problems listed above in the chief complaint. Discussed the use of AI scribe software for clinical note transcription with the patient, who gave verbal consent to proceed. I reviewed chart and recent ER note.  Full documentation on a complete at the time of this visit. History of Present Illness Valerie Kim is a 38 year old female who presents for follow-up after an emergency room visit for a suspected Rosebush infection.  Evaluated yesterday.  Cutaneous lesions and pruritus - Lesions present between fingers and on neck - Lesions are pruritic with intense itching - No resemblance to prior poison ivy reactions, has long history of severe allergic reactions to poison ivy.  This seems very different. 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 Symptoms align with sporotrichosis, a rare fungal infection.  Initial treatment with Keflex was started to rule out bacterial infection. Antifungal treatment is indicated given the clinical presentation and exposure history. She is aware of the need for long-term antifungal therapy and is concerned about potential respiratory spread. - Start antifungal treatment immediately. - Contact dermatologist for follow-up and potential fungal culture or biopsy to confirm diagnosis. - Document current lesions with photographs for medical records.  Pruritus of skin Severe itching associated with skin lesions. Differential diagnosis includes allergic dermatitis, but presentation is more consistent with fungal infection. - Continue using hydrocortisone and topical Benadryl  for symptomatic relief.    Follow up: As needed No orders of the defined types were placed in this encounter.  No orders of the defined types were placed in this encounter.    I reviewed the patients updated PMH, FH, and SocHx.  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   Abdominal pain 01/10/2023   Pain in joint, multiple sites 01/10/2023   Chest pain 01/10/2023   Low grade squamous intraepithelial lesion (LGSIL) on Papanicolaou smear of cervix 01/10/2023   Malaise and fatigue 01/10/2023   Migraine headache 01/10/2023   OCD (obsessive compulsive disorder) 01/10/2023   Rash and other nonspecific skin eruption 01/10/2023   Pain on swallowing 01/10/2023   Palpitations 01/10/2023   Personal history of cardiovascular disorder 01/10/2023   Personal history of tobacco use, presenting hazards to health 01/10/2023   Sacroiliitis 01/10/2023   Screening examination for pulmonary tuberculosis 01/10/2023   Screening for malignant neoplasm of cervix 01/10/2023   Sinusitis 01/10/2023   Sleep disturbances 01/10/2023   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/10/2023   Urinary tract infection 01/10/2023  Examination of eyes and vision 01/10/2023    Screening for depression 01/10/2023   Bipolar disorder (HCC) 01/10/2023   Depression 01/10/2023   PTSD (post-traumatic stress disorder) 01/10/2023   Abnormal findings on antenatal screening 11/22/2022   Acquired scoliosis 11/22/2022   Acute bronchitis 11/22/2022   ADD (attention deficit disorder) 11/22/2022   Adjustment disorder with depressed mood 11/22/2022   Allergic rhinitis 11/22/2022   Allergies 11/22/2022   Cough 11/22/2022   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   Esophageal dysphagia 10/15/2022   Frequent headaches 10/15/2022   Epigastric abdominal pain 10/15/2022   Lumbar spondylosis 05/23/2022   Esophageal reflux 05/23/2022   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   cephALEXin (KEFLEX) 500 MG capsule Take 2 capsules (1,000 mg total) by mouth 2 (two) times daily.   cetirizine  (ZYRTEC ) 10 MG tablet Take 1 tablet (10 mg total) by mouth at bedtime.   dexlansoprazole  (DEXILANT ) 60 MG capsule Take 1 capsule (60 mg total) by mouth daily.   itraconazole (SPORANOX) 100 MG capsule Take 1 capsule (100 mg total) by mouth daily.   tretinoin  (RETIN-A ) 0.025 % cream Apply 1 Application topically every evening.   Allergies: Patient has no known allergies. Family History: Patient family history includes ADD / ADHD in her brother and son; Alcohol abuse in her father; Anxiety disorder in her daughter, daughter, and daughter; Arthritis in her father and mother;  Bipolar disorder in her father and mother; Depression in her daughter, daughter, father, and mother; Heart disease in her paternal grandfather and paternal grandmother; Hyperlipidemia in her father and mother; Hypertension in her brother, father, paternal grandfather, and paternal grandmother; Learning disabilities in her brother; Miscarriages / [place] in her mother; Thyroid  disease in her mother; Varicose Veins in her mother. Social History:  Patient  reports that she quit smoking about 8 years ago. Her smoking use included cigarettes and e-cigarettes. She started smoking about 23 years ago. She has a 22.5 pack-year smoking history. She has never used smokeless tobacco. She reports current alcohol use. She reports current drug use. Frequency: 7.00 times per week. Drugs: Marijuana and Psilocybin.  Review of Systems: Constitutional: Negative for fever malaise or anorexia Cardiovascular: negative for chest pain Respiratory: negative for SOB or persistent cough Gastrointestinal: negative for abdominal pain  Objective  Vitals: BP 123/78   Pulse 67   Temp 97.8 F (36.6 C)   Ht 5' 10 (1.778 m)   Wt 211 lb (95.7 kg)   LMP 10/26/2023 (Exact Date)   SpO2 99%   BMI 30.28 kg/m  General: no acute distress , A&Ox3  Commons side effects, risks, benefits, and alternatives for medications and treatment plan prescribed today were discussed, and the patient expressed understanding of the given instructions. Patient is instructed to call or message via MyChart if he/she has any questions or concerns regarding our treatment plan. No barriers to understanding were identified. We discussed Red Flag symptoms and signs in detail. Patient expressed understanding regarding what to do in case of urgent  or emergency type symptoms.  Medication list was reconciled, printed and provided to the patient in AVS. Patient instructions and summary information was reviewed with the patient as documented in the AVS. This  note was prepared with assistance of Dragon voice recognition software. Occasional wrong-word or sound-a-like substitutions may have occurred due to the inherent limitations of voice recognition software

## 2023-11-06 ENCOUNTER — Other Ambulatory Visit (HOSPITAL_COMMUNITY): Payer: Self-pay

## 2023-11-06 DIAGNOSIS — L309 Dermatitis, unspecified: Secondary | ICD-10-CM | POA: Diagnosis not present

## 2023-11-06 DIAGNOSIS — B4289 Other forms of sporotrichosis: Secondary | ICD-10-CM | POA: Diagnosis not present

## 2023-11-06 DIAGNOSIS — D485 Neoplasm of uncertain behavior of skin: Secondary | ICD-10-CM | POA: Diagnosis not present

## 2023-11-06 DIAGNOSIS — L308 Other specified dermatitis: Secondary | ICD-10-CM | POA: Diagnosis not present

## 2023-11-06 MED ORDER — TRIAMCINOLONE ACETONIDE 0.1 % EX OINT
1.0000 | TOPICAL_OINTMENT | Freq: Two times a day (BID) | CUTANEOUS | 3 refills | Status: DC
  Filled 2023-11-06: qty 30, 15d supply, fill #0

## 2023-11-07 ENCOUNTER — Telehealth: Payer: Self-pay

## 2023-11-07 NOTE — Telephone Encounter (Signed)
 Transition Care Management Follow-up Telephone Call Date of discharge and from where: 11/04/23 [facility] How have you been since you were released from the hospital? Better Any questions or concerns? No  Items Reviewed: Did the pt receive and understand the discharge instructions provided? Yes  Medications obtained and verified? Yes  Other? No  Any new allergies since your discharge? No  Dietary orders reviewed? No Do you have support at home? Yes   Home Care and Equipment/Supplies: Were home health services ordered? not applicable If so, what is the name of the agency?   Has the agency set up a time to come to the patient's home? not applicable Were any new equipment or medical supplies ordered?  No What is the name of the medical supply agency?  Were you able to get the supplies/equipment? not applicable Do you have any questions related to the use of the equipment or supplies? No  Functional Questionnaire: (I = Independent and D = Dependent) ADLs:   Bathing/Dressing-   Meal Prep-   Eating-   Maintaining continence-   Transferring/Ambulation-   Managing Meds-   Follow up appointments reviewed:  PCP Hospital f/u appt confirmed? Yes  Pt seen by MD in office since discharge Specialist Hospital f/u appt confirmed? Are transportation arrangements needed? No  If their condition worsens, is the pt aware to call PCP or go to the Emergency Dept.? Yes Was the patient provided with contact information for the PCP's office or ED? Yes Was to pt encouraged to call back with questions or concerns? Yes

## 2023-11-18 DIAGNOSIS — Z419 Encounter for procedure for purposes other than remedying health state, unspecified: Secondary | ICD-10-CM | POA: Diagnosis not present

## 2023-11-21 ENCOUNTER — Other Ambulatory Visit (HOSPITAL_COMMUNITY): Payer: Self-pay

## 2023-11-21 ENCOUNTER — Telehealth (INDEPENDENT_AMBULATORY_CARE_PROVIDER_SITE_OTHER): Admitting: Physician Assistant

## 2023-11-21 ENCOUNTER — Encounter: Payer: Self-pay | Admitting: Physician Assistant

## 2023-11-21 DIAGNOSIS — L299 Pruritus, unspecified: Secondary | ICD-10-CM

## 2023-11-21 DIAGNOSIS — J069 Acute upper respiratory infection, unspecified: Secondary | ICD-10-CM | POA: Diagnosis not present

## 2023-11-21 DIAGNOSIS — B429 Sporotrichosis, unspecified: Secondary | ICD-10-CM

## 2023-11-21 DIAGNOSIS — L0100 Impetigo, unspecified: Secondary | ICD-10-CM | POA: Diagnosis not present

## 2023-11-21 MED ORDER — MUPIROCIN 2 % EX OINT
1.0000 | TOPICAL_OINTMENT | Freq: Three times a day (TID) | CUTANEOUS | 1 refills | Status: AC
  Filled 2023-11-21: qty 22, 8d supply, fill #0

## 2023-11-21 NOTE — Progress Notes (Signed)
 Virtual Visit via Video Note  I connected with  Valerie Kim  on 11/21/23 at 10:00 AM EDT by a video enabled telemedicine application and verified that I am speaking with the correct person using two identifiers.  Location: Patient: home Provider: Nature conservation officer at [employer] Persons present: Patient and myself   I discussed the limitations of evaluation and management by telemedicine and the availability of in person appointments. The patient expressed understanding and agreed to proceed.   History of Present Illness:  Discussed the use of AI scribe software for clinical note transcription with the patient, who gave verbal consent to proceed.  History of Present Illness Valerie Kim is a 38 year old female who presents with nasal swelling and crusting.  She has been experiencing nasal symptoms for about a week, with swelling on the side of her nose that began on Monday, followed by crusting and significant pain. She applied Abreva last night and this morning, but continues to have a runny nose requiring frequent blowing. The symptoms are localized inside her nostril and have not spread to her cheek.  She has a history of cold sores in her nose, but this episode is more severe. She had an ER visit and a consultation with a dermatologist, who diagnosed her with spongiotic dermatitis. She did not take the prescribed antibiotics (Keflex) after these visits. 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 She did not take Keflex as the dermatologist deemed it unnecessary for dermatitis.  She started experiencing cold symptoms last Tuesday with a sore throat, which progressed to watery eyes, nasal congestion, and a cough by Friday. The cough is now productive and has moved to her chest. She feels less tired and more able to work, but the symptoms persist. She has had bronchitis in the past but does not feel the same this  time. The cold sores appeared after the onset of the cold symptoms.  She has a history of severe allergic reactions to poison ivy and skin itching.     Observations/Objective:   Gen: Awake, alert, no acute distress Resp: Breathing is even and non-labored Psych: calm/pleasant demeanor Neuro: Alert and Oriented x 3, + facial symmetry, speech is clear. Skin: Inside R nare erythema and crusting, mild edema present    Assessment and Plan:  Assessment and Plan Assessment & Plan Nasal impetigo Swelling and crusting on the side of the nose, likely impetigo. Recent antibiotic use. No spread to cheek or other areas. Did not take previously prescribed Keflex due to rapid dermatology appointment and lack of infection confirmation. - Prescribe Bactroban cream, apply two to three times daily with a Q-tip. - Monitor for signs of spreading or worsening. If symptoms worsen, consider starting Keflex, which is available at home.  Acute upper respiratory infection Symptoms started with a sore throat, progressed to watery eyes, runny nose, and productive cough. Symptoms have improved but still present. History of bronchitis, but current symptoms do not suggest bronchitis.  Allergic reactions and dermatitis Previous severe allergic reactions to poison ivy and spongyotic dermatitis. Dermatology visit confirmed bed bug bites, not sclerotrichosis. Considering allergist referral for further evaluation. - Referral to allergy center for evaluation.  General Health Maintenance Discussion on immune support and vaccinations. Considering flu and COVID vaccinations but cautious due to current health issues. - Recommend zinc, vitamin C, and vitamin D  supplements for immune support. - Discuss flu vaccination, prefers to wait until current health issues resolve. -  Discuss COVID vaccination availability and recommend scheduling at a [facility] pharmacy.  Follow-up No current follow-up appointment scheduled. Last  physical was in December. - Schedule physical examination for December or January. - Follow up if nasal symptoms worsen or spread.    Follow Up Instructions:    I discussed the assessment and treatment plan with the patient. The patient was provided an opportunity to ask questions and all were answered. The patient agreed with the plan and demonstrated an understanding of the instructions.   The patient was advised to call back or seek an in-person evaluation if the symptoms worsen or if the condition fails to improve as anticipated.  Hailynn Slovacek M Londan Coplen, PA-C

## 2023-11-21 NOTE — Telephone Encounter (Signed)
 Please see pt msg, needs appt?

## 2023-11-21 NOTE — Telephone Encounter (Signed)
 Please see msg from PCP and schedule patient for appt, virtual ok

## 2023-11-30 ENCOUNTER — Other Ambulatory Visit (HOSPITAL_COMMUNITY): Payer: Self-pay

## 2023-11-30 ENCOUNTER — Other Ambulatory Visit: Payer: Self-pay

## 2023-11-30 ENCOUNTER — Other Ambulatory Visit: Payer: Self-pay | Admitting: Physician Assistant

## 2023-11-30 DIAGNOSIS — R1013 Epigastric pain: Secondary | ICD-10-CM

## 2023-11-30 NOTE — Telephone Encounter (Signed)
 Ok to increase to 90 day supply for patient?

## 2023-12-02 MED ORDER — DEXLANSOPRAZOLE 60 MG PO CPDR
60.0000 mg | DELAYED_RELEASE_CAPSULE | Freq: Every day | ORAL | 1 refills | Status: AC
  Filled 2023-12-02: qty 90, 90d supply, fill #0
  Filled 2023-12-03: qty 30, 30d supply, fill #0
  Filled 2023-12-04 (×2): qty 90, 90d supply, fill #0
  Filled 2023-12-28 – 2024-02-25 (×2): qty 90, 90d supply, fill #1
  Filled 2024-02-26: qty 30, 30d supply, fill #1

## 2023-12-03 ENCOUNTER — Telehealth (HOSPITAL_COMMUNITY): Payer: Self-pay

## 2023-12-03 ENCOUNTER — Other Ambulatory Visit (HOSPITAL_COMMUNITY): Payer: Self-pay

## 2023-12-03 NOTE — Telephone Encounter (Signed)
 Pharmacy Patient Advocate Encounter   Received notification from Pt Calls Messages that prior authorization for Dexlansoprazole  60MG  dr capsules  is required/requested.   Insurance verification completed.   The patient is insured through [employer].   Per test claim: PA required; PA submitted to above mentioned insurance via Latent Key/confirmation #/EOC [id number] Status is pending

## 2023-12-03 NOTE — Telephone Encounter (Signed)
 PA request has been Received. New Encounter has been or will be created for follow up. For additional info see Pharmacy Prior Auth telephone encounter from 12/03/23.

## 2023-12-04 ENCOUNTER — Other Ambulatory Visit (HOSPITAL_COMMUNITY): Payer: Self-pay

## 2023-12-04 NOTE — Telephone Encounter (Signed)
 Pharmacy Patient Advocate Encounter  Received notification from [facility] MEDICAID that Prior Authorization for Dexlansoprazole  60mg  caps has been APPROVED from 12/03/23 to 12/02/24   PA #/Case ID/Reference #: [id number]  Approval letter indexed to media tab

## 2023-12-19 DIAGNOSIS — Z419 Encounter for procedure for purposes other than remedying health state, unspecified: Secondary | ICD-10-CM | POA: Diagnosis not present

## 2023-12-21 ENCOUNTER — Other Ambulatory Visit (HOSPITAL_COMMUNITY): Payer: Self-pay

## 2023-12-21 ENCOUNTER — Other Ambulatory Visit: Payer: Self-pay

## 2023-12-21 ENCOUNTER — Ambulatory Visit (INDEPENDENT_AMBULATORY_CARE_PROVIDER_SITE_OTHER): Admitting: Internal Medicine

## 2023-12-21 ENCOUNTER — Encounter: Payer: Self-pay | Admitting: Internal Medicine

## 2023-12-21 VITALS — BP 112/84 | HR 82 | Temp 98.3°F | Ht 68.5 in | Wt 206.8 lb

## 2023-12-21 DIAGNOSIS — R21 Rash and other nonspecific skin eruption: Secondary | ICD-10-CM | POA: Diagnosis not present

## 2023-12-21 DIAGNOSIS — J3089 Other allergic rhinitis: Secondary | ICD-10-CM | POA: Diagnosis not present

## 2023-12-21 MED ORDER — CETIRIZINE HCL 10 MG PO TABS
10.0000 mg | ORAL_TABLET | Freq: Every day | ORAL | 5 refills | Status: DC
  Filled 2023-12-21: qty 30, 30d supply, fill #0

## 2023-12-21 MED ORDER — MOMETASONE FUROATE 50 MCG/ACT NA SUSP
2.0000 | Freq: Every day | NASAL | 5 refills | Status: AC
  Filled 2023-12-21 – 2024-02-25 (×3): qty 17, 30d supply, fill #0

## 2023-12-21 NOTE — Progress Notes (Signed)
 NEW PATIENT  Date of Service/Encounter:  12/21/23  Consult requested by: Allwardt, Mardy HERO, PA-C   Subjective:   Valerie Kim (DOB: Jun 12, 1985) is a 38 y.o. female who presents to the clinic on 12/21/2023 with a chief complaint of allergies, rashes.   .    History obtained from: chart review and patient.    Rhinitis:  Started around 9.   Symptoms include: nasal congestion, rhinorrhea, post nasal drainage, and sneezing  Smell has changed since COVID but has improved   Occurs year-round Potential triggers: not sure  Treatments tried: Zyrtec   Flonase causes nosebleeds   Previous allergy testing: no History of sinus surgery: no Nonallergic triggers: strong scents    Rash: Notes history of various rashes In January had bed bug bites; others in family were effected too and rashes resolved after treating the house About a month ago, was gardening and had injury from thorns-roses.  Developed significant swelling and rashes, saw Derm and was biopsied and it showed spongiotic dermatitis.  Also randomly just breaks out in nonspecific rashes.   Reviewed:  11/04/2023: seen in ED for itchy burning rash, onset after gardening.  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  04/11/2023: seen by Allwardt PA for diffuse urticaria lesions, seen on exam, used Zyrtec /Pepcid  without relief.  Also on amitriptyline  for migraines and anxiety.  11/28/2022: seen by GI for GERD, weight loss, diarrhea/constipation.  Start Dexilant , plan for EGD.   12/15/2021: seen by Cardiology for SVT and palpitations s/p ablation.  Past Medical History: Past Medical History:  Diagnosis Date   Allergies 11/22/2022   Annual physical exam 08/19/2020   Anxiety    Common cold 11/22/2022   increase po fluids, rest, tylenol prn, robitussin prn cough     Depression    Encounter for postpartum care of lactating mother 01/10/2023   Encounter for supervision of normal pregnancy  in multigravida 01/10/2023   s=d; reassuring fwb. reviewed 2nd trim prec in detail.     Endometriosis    Essential hypertension 01/10/2023   GERD (gastroesophageal reflux disease)    Health examination of defined subpopulation 01/10/2023   Migraines    Need for prophylactic vaccination with combined vaccine 01/10/2023   Need for prophylactic vaccination with measles-mumps-rubella (MMR) vaccine 01/10/2023   Person encountering health services to consult on behalf of another person 01/10/2023   Routine general medical examination at a health care facility 01/10/2023   Routine postpartum follow-up 01/10/2023   Routine postpartum follow-up 01/10/2023   Thyroid  disease 2020   3mm nodule noted and monitored through US    Transient hypertension of pregnancy, antepartum 01/10/2023   Past Surgical History: Past Surgical History:  Procedure Laterality Date   ABLATION     cardiac   HYSTEROSCOPY      Family History: Family History  Problem Relation Age of Onset   Hyperlipidemia Mother    Thyroid  disease Mother    Depression Mother    Bipolar disorder Mother    Arthritis Mother    Miscarriages / Stillbirths Mother    Varicose Veins Mother    Hyperlipidemia Father    Alcohol abuse Father    Depression Father    Bipolar disorder Father    Arthritis Father    Hypertension Father    ADD / ADHD Brother    Hypertension Brother    Learning disabilities Brother    Hypertension Paternal Grandmother    Heart disease Paternal Grandmother    Heart disease Paternal Grandfather  Hypertension Paternal Grandfather    Asthma Daughter    Urticaria Daughter    Anxiety disorder Daughter    Depression Daughter    Anxiety disorder Daughter    Depression Daughter    Urticaria Daughter    Anxiety disorder Daughter    ADD / ADHD Son    Colon cancer Neg Hx    Esophageal cancer Neg Hx    Rectal cancer Neg Hx    Stomach cancer Neg Hx     Social History:  Flooring in bedroom: carpet Pets: cat  and dog Tobacco use/exposure: none Job: bookeeper   Medication List:  Allergies as of 12/21/2023   No Known Allergies      Medication List        Accurate as of December 21, 2023  2:03 PM. If you have any questions, ask your nurse or doctor.          amitriptyline  25 MG tablet Commonly known as: ELAVIL  Take 1 tablet (25 mg total) by mouth at bedtime.   cephALEXin 500 MG capsule Commonly known as: KEFLEX Take 2 capsules (1,000 mg total) by mouth 2 (two) times daily.   cetirizine  10 MG tablet Commonly known as: ZYRTEC  Take 1 tablet (10 mg total) by mouth daily. What changed: when to take this Changed by: Arleta SHAUNNA Blanch   dexlansoprazole  60 MG capsule Commonly known as: Dexilant  Take 1 capsule (60 mg total) by mouth daily.   mometasone 50 MCG/ACT nasal spray Commonly known as: Nasonex Place 2 sprays into the nose daily. Started by: Arleta SHAUNNA Blanch   mupirocin ointment 2 % Commonly known as: BACTROBAN Apply 1 Application topically 3 (three) times daily.   tretinoin  0.025 % cream Commonly known as: RETIN-A  Apply 1 Application topically every evening.   triamcinolone ointment 0.1 % Commonly known as: KENALOG Apply 1 Application topically 2 (two) times daily.         REVIEW OF SYSTEMS: Pertinent positives and negatives discussed in HPI.   Objective:   Physical Exam: BP 112/84 (BP Location: Left Arm, Patient Position: Sitting, Cuff Size: Normal)   Pulse 82   Temp 98.3 F (36.8 C) (Temporal)   Ht 5' 8.5 (1.74 m)   Wt 206 lb 12.8 oz (93.8 kg)   SpO2 98%   BMI 30.98 kg/m  Body mass index is 30.98 kg/m. GEN: alert, well developed HEENT: clear conjunctiva, nose with + mild inferior turbinate hypertrophy, pink nasal mucosa, slight clear rhinorrhea, + cobblestoning HEART: regular rate and rhythm, no murmur LUNGS: clear to auscultation bilaterally, no coughing, unlabored respiration ABDOMEN: soft, non distended  SKIN: no rashes or lesions  Assessment:    1. Other allergic rhinitis   2. Rash and nonspecific skin eruption   3. Urticaria     Plan/Recommendations:   Other Allergic Rhinitis: - Due to turbinate hypertrophy, seasonal symptoms and unresponsive to over the counter meds, will perform skin testing to identify aeroallergen triggers.   - Use nasal saline rinses before nose sprays such as with Neilmed Sinus Rinse.  Use distilled water.   - Use Nasonex or Nasacort 2 sprays each nostril daily. Aim upward and outward. - Use Zyrtec  10 mg daily.   Rash - Take pictures of any further rashes.  - Rashes described were not allergic in nature.    11/21 at 9 30 for skin testing 1-55, IDs okay   Arleta Blanch, MD [facility] 

## 2023-12-21 NOTE — Patient Instructions (Addendum)
 Other Allergic Rhinitis: - Use nasal saline rinses before nose sprays such as with Neilmed Sinus Rinse.  Use distilled water.   - Use Nasonex or Nasacort 2 sprays each nostril daily. Aim upward and outward. - Use Zyrtec  10 mg daily.   Rash - Take pictures of any further rashes.    Hold all anti-histamines (Xyzal, Allegra, Zyrtec , Claritin , Benadryl , Pepcid ) 3 days prior to next visit.  Follow up: 11/21 at 9 30 for skin testing 1-55

## 2023-12-28 ENCOUNTER — Ambulatory Visit (INDEPENDENT_AMBULATORY_CARE_PROVIDER_SITE_OTHER): Admitting: Internal Medicine

## 2023-12-28 ENCOUNTER — Other Ambulatory Visit: Payer: Self-pay | Admitting: Physician Assistant

## 2023-12-28 ENCOUNTER — Other Ambulatory Visit: Payer: Self-pay

## 2023-12-28 ENCOUNTER — Other Ambulatory Visit (HOSPITAL_COMMUNITY): Payer: Self-pay

## 2023-12-28 DIAGNOSIS — J3089 Other allergic rhinitis: Secondary | ICD-10-CM | POA: Diagnosis not present

## 2023-12-28 DIAGNOSIS — G43009 Migraine without aura, not intractable, without status migrainosus: Secondary | ICD-10-CM

## 2023-12-28 MED ORDER — AMITRIPTYLINE HCL 25 MG PO TABS
25.0000 mg | ORAL_TABLET | Freq: Every day | ORAL | 2 refills | Status: AC
  Filled 2023-12-28: qty 30, 30d supply, fill #0
  Filled 2024-02-04: qty 30, 30d supply, fill #1
  Filled 2024-02-25 – 2024-02-28 (×2): qty 30, 30d supply, fill #2

## 2023-12-28 MED ORDER — AZELASTINE HCL 0.1 % NA SOLN
2.0000 | Freq: Two times a day (BID) | NASAL | 11 refills | Status: AC | PRN
  Filled 2023-12-28: qty 30, 25d supply, fill #0
  Filled 2024-02-04: qty 30, 25d supply, fill #1

## 2023-12-28 NOTE — Progress Notes (Signed)
 FOLLOW UP Date of Service/Encounter:  12/28/23   Subjective:  Valerie Kim (DOB: [birth year]) is a 38 y.o. female who [facility] for follow up for skin testing.   History obtained from: chart review and patient.  Anti histamines held.   Past Medical History: Past Medical History:  Diagnosis Date   Allergies 11/22/2022   Annual physical exam 08/19/2020   Anxiety    Common cold 11/22/2022   increase po fluids, rest, tylenol prn, robitussin prn cough     Depression    Encounter for postpartum care of lactating mother 01/10/2023   Encounter for supervision of normal pregnancy in multigravida 01/10/2023   s=d; reassuring fwb. reviewed 2nd trim prec in detail.     Endometriosis    Essential hypertension 01/10/2023   GERD (gastroesophageal reflux disease)    Health examination of defined subpopulation 01/10/2023   Migraines    Need for prophylactic vaccination with combined vaccine 01/10/2023   Need for prophylactic vaccination with measles-mumps-rubella (MMR) vaccine 01/10/2023   Person encountering health services to consult on behalf of another person 01/10/2023   Routine general medical examination at a health care facility 01/10/2023   Routine postpartum follow-up 01/10/2023   Routine postpartum follow-up 01/10/2023   Thyroid  disease 2020   3mm nodule noted and monitored through US    Transient hypertension of pregnancy, antepartum 01/10/2023    Objective:  There were no vitals taken for this visit. There is no height or weight on file to calculate BMI. Physical Exam: GEN: alert, well developed HEENT: clear conjunctiva, MMM LUNGS: unlabored respiration   Skin Testing:  Skin prick testing was placed, which includes aeroallergens/foods, histamine control, and saline control.  Verbal consent was obtained prior to placing test.  Patient tolerated procedure well.  Allergy  testing results were read and interpreted by myself,  documented by clinical staff. Adequate positive and negative control.  Positive results to:  Results discussed with patient/family.  Airborne Adult Perc - 12/28/23 0948     Time Antigen Placed 0948    Allergen Manufacturer Jestine    Location Back    Number of Test 55    1. Control-Buffer 50% Glycerol Negative    2. Control-Histamine 3+    3. Bahia Negative    4. Bermuda Negative    5. Johnson Negative    6. [place]  Blue Negative    7. Meadow Fescue Negative    8. Perennial Rye Negative    9. Timothy Negative    10. Ragweed Mix Negative    11. Cocklebur Negative    12. Plantain,  English Negative    13. Baccharis Negative    14. Dog Fennel Negative    15. Russian Thistle Negative    16. Lamb's Quarters Negative    17. Sheep Sorrell Negative    18. Rough Pigweed Negative    19. Marsh Elder, Rough Negative    20. Mugwort, Common Negative    21. Box, Elder Negative    22. Cedar, red Negative    23. Sweet Gum Negative    24. Pecan Pollen Negative    25. Pine Mix Negative    26. Walnut, Black Pollen Negative    27. Red Mulberry Negative    28. Ash Mix Negative    29. Birch Mix Negative    30. Beech American Negative    31. Cottonwood, Eastern Negative    32. Hickory, White Negative    33. Maple Mix Negative  34. Oak, Eastern Mix Negative    35. Sycamore Eastern Negative    36. Alternaria Alternata Negative    37. Cladosporium Herbarum Negative    38. Aspergillus Mix Negative    39. Penicillium Mix Negative    40. Bipolaris Sorokiniana (Helminthosporium) Negative    41. Drechslera Spicifera (Curvularia) Negative    42. Mucor Plumbeus Negative    43. Fusarium Moniliforme Negative    44. Aureobasidium Pullulans (pullulara) Negative    45. Rhizopus Oryzae Negative    46. Botrytis Cinera Negative    47. Epicoccum Nigrum Negative    48. Phoma Betae Negative    49. Dust Mite Mix Negative    50. Cat Hair 10,000 BAU/ml Negative    51.  Dog Epithelia Negative    52.  Mixed Feathers Negative    53. Horse Epithelia Negative    54. Cockroach, German Negative    55. Tobacco Leaf Negative          Intradermal - 12/28/23 1007     Number of Test 16    Control Negative    Bahia Negative    Bermuda Negative    Johnson Negative    7 Grass Negative    Ragweed Mix Negative    Weed Mix Negative    Tree Mix Negative    Mold 1 Negative    Mold 2 Negative    Mold 3 Negative    Mold 4 Negative    Mite Mix Negative    Cat Negative    Dog Negative    Cockroach Negative           Assessment:   1. Other allergic rhinitis     Plan/Recommendations:  Other Allergic Rhinitis: - Due to turbinate hypertrophy, seasonal symptoms and unresponsive to over the counter meds, will perform skin testing to identify aeroallergen triggers.   - Positive skin test 12/2023: negative - Use nasal saline rinses before nose sprays such as with Neilmed Sinus Rinse.  Use distilled water.   - Use Nasonex  or Nasacort  2 sprays each nostril daily. Aim upward and outward.  - Use Azelastine  2 sprays each nostril twice daily as needed for runny nose, drainage, sneezing, congestion. Aim upward and outward.      Return if symptoms worsen or fail to improve.  Arleta Blanch, MD [facility] 

## 2023-12-28 NOTE — Patient Instructions (Addendum)
 Chronic Rhinitis: - Positive skin test 12/2023: negative - Use nasal saline rinses before nose sprays such as with Neilmed Sinus Rinse.  Use distilled water.   - Use Nasonex  or Nasacort  2 sprays each nostril daily. Aim upward and outward.  - Use Azelastine  2 sprays each nostril twice daily as needed for runny nose, drainage, sneezing, congestion. Aim upward and outward.

## 2024-01-21 ENCOUNTER — Other Ambulatory Visit (HOSPITAL_COMMUNITY): Payer: Self-pay

## 2024-01-21 MED ORDER — CLINDAMYCIN PHOSPHATE 1 % EX LOTN
1.0000 | TOPICAL_LOTION | Freq: Every day | CUTANEOUS | 11 refills | Status: AC
  Filled 2024-01-21: qty 60, 30d supply, fill #0
  Filled 2024-02-25: qty 60, 30d supply, fill #1

## 2024-02-05 ENCOUNTER — Other Ambulatory Visit (HOSPITAL_COMMUNITY): Payer: Self-pay

## 2024-02-05 ENCOUNTER — Encounter: Payer: Self-pay | Admitting: Physician Assistant

## 2024-02-05 ENCOUNTER — Ambulatory Visit (INDEPENDENT_AMBULATORY_CARE_PROVIDER_SITE_OTHER): Admitting: Physician Assistant

## 2024-02-05 ENCOUNTER — Other Ambulatory Visit: Payer: Self-pay

## 2024-02-05 VITALS — BP 122/84 | HR 94 | Temp 98.1°F | Ht 68.5 in | Wt 202.6 lb

## 2024-02-05 DIAGNOSIS — Z323 Encounter for childcare instruction: Secondary | ICD-10-CM | POA: Insufficient documentation

## 2024-02-05 DIAGNOSIS — O3440 Maternal care for other abnormalities of cervix, unspecified trimester: Secondary | ICD-10-CM | POA: Insufficient documentation

## 2024-02-05 DIAGNOSIS — Z0000 Encounter for general adult medical examination without abnormal findings: Secondary | ICD-10-CM | POA: Diagnosis not present

## 2024-02-05 DIAGNOSIS — Z0001 Encounter for general adult medical examination with abnormal findings: Secondary | ICD-10-CM | POA: Diagnosis not present

## 2024-02-05 DIAGNOSIS — Z3041 Encounter for surveillance of contraceptive pills: Secondary | ICD-10-CM | POA: Insufficient documentation

## 2024-02-05 DIAGNOSIS — L700 Acne vulgaris: Secondary | ICD-10-CM | POA: Insufficient documentation

## 2024-02-05 DIAGNOSIS — L301 Dyshidrosis [pompholyx]: Secondary | ICD-10-CM

## 2024-02-05 DIAGNOSIS — F3281 Premenstrual dysphoric disorder: Secondary | ICD-10-CM

## 2024-02-05 DIAGNOSIS — M25552 Pain in left hip: Secondary | ICD-10-CM | POA: Diagnosis not present

## 2024-02-05 DIAGNOSIS — Z23 Encounter for immunization: Secondary | ICD-10-CM

## 2024-02-05 DIAGNOSIS — Z0110 Encounter for examination of ears and hearing without abnormal findings: Secondary | ICD-10-CM | POA: Insufficient documentation

## 2024-02-05 DIAGNOSIS — E119 Type 2 diabetes mellitus without complications: Secondary | ICD-10-CM | POA: Insufficient documentation

## 2024-02-05 DIAGNOSIS — M2550 Pain in unspecified joint: Secondary | ICD-10-CM | POA: Diagnosis not present

## 2024-02-05 DIAGNOSIS — G8929 Other chronic pain: Secondary | ICD-10-CM

## 2024-02-05 DIAGNOSIS — N939 Abnormal uterine and vaginal bleeding, unspecified: Secondary | ICD-10-CM | POA: Diagnosis not present

## 2024-02-05 DIAGNOSIS — M25559 Pain in unspecified hip: Secondary | ICD-10-CM | POA: Insufficient documentation

## 2024-02-05 DIAGNOSIS — Z0289 Encounter for other administrative examinations: Secondary | ICD-10-CM | POA: Insufficient documentation

## 2024-02-05 DIAGNOSIS — L918 Other hypertrophic disorders of the skin: Secondary | ICD-10-CM | POA: Insufficient documentation

## 2024-02-05 DIAGNOSIS — Z760 Encounter for issue of repeat prescription: Secondary | ICD-10-CM | POA: Insufficient documentation

## 2024-02-05 LAB — VITAMIN D 25 HYDROXY (VIT D DEFICIENCY, FRACTURES): VITD: 23.01 ng/mL — ABNORMAL LOW (ref 30.00–100.00)

## 2024-02-05 LAB — COMPREHENSIVE METABOLIC PANEL WITH GFR
ALT: 11 U/L (ref 3–35)
AST: 17 U/L (ref 5–37)
Albumin: 4.5 g/dL (ref 3.5–5.2)
Alkaline Phosphatase: 68 U/L (ref 39–117)
BUN: 11 mg/dL (ref 6–23)
CO2: 28 meq/L (ref 19–32)
Calcium: 9.3 mg/dL (ref 8.4–10.5)
Chloride: 106 meq/L (ref 96–112)
Creatinine, Ser: 0.79 mg/dL (ref 0.40–1.20)
GFR: 94.09 mL/min
Glucose, Bld: 91 mg/dL (ref 70–99)
Potassium: 3.7 meq/L (ref 3.5–5.1)
Sodium: 140 meq/L (ref 135–145)
Total Bilirubin: 0.7 mg/dL (ref 0.2–1.2)
Total Protein: 7.1 g/dL (ref 6.0–8.3)

## 2024-02-05 LAB — CBC WITH DIFFERENTIAL/PLATELET
Basophils Absolute: 0 K/uL (ref 0.0–0.1)
Basophils Relative: 0.5 % (ref 0.0–3.0)
Eosinophils Absolute: 0.1 K/uL (ref 0.0–0.7)
Eosinophils Relative: 1.4 % (ref 0.0–5.0)
HCT: 40.1 % (ref 36.0–46.0)
Hemoglobin: 13.6 g/dL (ref 12.0–15.0)
Lymphocytes Relative: 39.5 % (ref 12.0–46.0)
Lymphs Abs: 1.9 K/uL (ref 0.7–4.0)
MCHC: 33.8 g/dL (ref 30.0–36.0)
MCV: 86.3 fl (ref 78.0–100.0)
Monocytes Absolute: 0.6 K/uL (ref 0.1–1.0)
Monocytes Relative: 11.8 % (ref 3.0–12.0)
Neutro Abs: 2.2 K/uL (ref 1.4–7.7)
Neutrophils Relative %: 46.8 % (ref 43.0–77.0)
Platelets: 342 K/uL (ref 150.0–400.0)
RBC: 4.65 Mil/uL (ref 3.87–5.11)
RDW: 13.1 % (ref 11.5–15.5)
WBC: 4.7 K/uL (ref 4.0–10.5)

## 2024-02-05 LAB — LIPID PANEL
Cholesterol: 164 mg/dL (ref 28–200)
HDL: 51.9 mg/dL
LDL Cholesterol: 97 mg/dL (ref 10–99)
NonHDL: 112.23
Total CHOL/HDL Ratio: 3
Triglycerides: 77 mg/dL (ref 10.0–149.0)
VLDL: 15.4 mg/dL (ref 0.0–40.0)

## 2024-02-05 LAB — VITAMIN B12: Vitamin B-12: 230 pg/mL (ref 211–911)

## 2024-02-05 LAB — HEMOGLOBIN A1C: Hgb A1c MFr Bld: 5.2 % (ref 4.6–6.5)

## 2024-02-05 LAB — TSH: TSH: 1.35 u[IU]/mL (ref 0.35–5.50)

## 2024-02-05 MED ORDER — TRIAMCINOLONE ACETONIDE 0.025 % EX CREA
1.0000 | TOPICAL_CREAM | Freq: Three times a day (TID) | CUTANEOUS | 0 refills | Status: AC
  Filled 2024-02-05: qty 15, 30d supply, fill #0

## 2024-02-05 NOTE — Progress Notes (Unsigned)
 "   Patient ID: Valerie Kim, female    DOB: 04-25-1985, 38 y.o.   MRN: [id number]   Assessment & Plan:  Annual physical exam -     CBC with Differential/Platelet -     Comprehensive metabolic panel with GFR -     Hemoglobin A1c -     Lipid panel -     TSH -     Vitamin B12 -     VITAMIN D  25 Hydroxy (Vit-D Deficiency, Fractures)  Immunization due -     Flu vaccine trivalent PF, 6mos and older(Flulaval,Afluria,Fluarix,Fluzone)  Arthralgia of multiple joints -     Ambulatory referral to Sports Medicine  Chronic left hip pain -     Ambulatory referral to Sports Medicine  Abnormal uterine bleeding -     US  PELVIC COMPLETE WITH TRANSVAGINAL; Future  Dyshidrotic eczema -     Triamcinolone  Acetonide; Apply 1 Application topically 3 (three) times daily.  Dispense: 45 g; Refill: 0  PMDD (premenstrual dysphoric disorder)     Assessment & Plan Chronic widespread joint pain and left hip pain Chronic widespread joint pain with significant left hip pain, described as grinding and popping, with a sensation of the hip falling out of socket. Pain exacerbated by prolonged sitting or standing. Negative ANA and rheumatoid factor. Concerns about Ehlers-Danlos Syndrome (EDS) and hypermobility. Previous lumbar spondylosis noted. Pain management with Voltaren  gel provides some relief. No current rheumatology follow-up. - Referred to Dr. Joane for evaluation of hypermobility and potential EDS. - Continue Voltaren  gel for pain management.  Abnormal uterine bleeding and ?endometriosis Abnormal uterine bleeding with irregular cycles, heavy periods, and endometriosis. Previous hysteroscopy and attempted ablation. Concerns about hormonal influences on symptoms. No recent ultrasound of uterus or ovaries. Discussion of potential adenomyosis and hormonal management options. She declined IUD for hormonal management. - Ordered pelvic ultrasound to be scheduled one week post-menstrual cycle. - Will discuss  potential hormonal management options with gynecology if needed.  Premenstrual dysphoric disorder Severe PMDD with significant irritability and mood changes premenstrually. Previous management with birth control to suppress periods. Current symptoms impacting quality of life and relationships. - Will discuss potential hormonal management options with gynecology if needed.  Dyshidrotic eczema Recurrent dyshidrotic eczema with bumpy lesions and dry patches. Previous treatment with triamcinolone . Current management with frequent moisturizing. - Prescribed higher dose steroid cream for dyshidrotic eczema, to be used 2-3 times daily.  General Health Maintenance Annual physical exam conducted. Flu vaccine administered. Discussion of upcoming mammogram due to age. No family history of colon cancer, no need for colonoscopy at this time. - Continue routine health maintenance and screenings. - Will schedule mammogram after 39th birthday.      Return in about 6 months (around 08/05/2024) for recheck/follow-up.    Subjective:    Chief Complaint  Patient presents with   Annual Exam    Pt in office for annual CPE and labs;     HPI Discussed the use of AI scribe software for clinical note transcription with the patient, who gave verbal consent to proceed.  History of Present Illness Valerie Kim is a 38 year old female who presents for her annual physical exam.  She experiences significant joint pain and hypermobility concerns, describing her joints as 'really bad right now.' Her hip feels like it 'keeps falling out of socket,' causing severe pain. There is popping, clicking, and grinding in her knees, shoulders, and wrists, with her hands becoming extremely painful when carrying or grasping objects for  extended periods. She uses Voltaren  on her back and hips, which provides some relief, especially during sleep. She has a history of lumbar spondylosis and had a back x-ray a couple of years  ago. Negative ANA and rheumatoid factor tests have been noted in the past.  She has gained back more than half the weight she previously lost and is unsure of the cause, noting that it started after beginning amitriptyline . She is unable to move as much due to pain, which may contribute to the weight gain.  She experiences significant hormonal symptoms, including 'ridiculous amounts of hot flashes' and 'brain fog.' Her menstrual cycle has become irregular, with periods sometimes being a week late and varying in duration and intensity. She has a history of endometriosis, diagnosed via hysteroscopy, and experiences severe premenstrual dysphoric disorder (PMDD), with irritability being a major symptom.  She reports skin changes and has been using clindamycin  for deep acne on her chest. She attempted retinol but experienced a severe sunburn-like reaction.  She has a history of chlamydia, treated with doxycycline, and has had negative HIV tests, the last being two years ago. She has had a polyp in the past, but her Pap smear last year was normal. She has not had a recent ultrasound of her uterus or ovaries.     Past Medical History:  Diagnosis Date   Allergies 11/22/2022   Annual physical exam 08/19/2020   Anxiety    Arrhythmia 2003   Symptoms began in high school but were later diagnosed during pregnancy   Common cold 11/22/2022   increase po fluids, rest, tylenol prn, robitussin prn cough     Depression    Diabetic eye exam (HCC) 02/05/2024   Encounter for childcare instruction 02/05/2024   Encounter for medication refill 02/05/2024   Encounter for postpartum care of lactating mother 01/10/2023   Encounter for supervision of normal pregnancy in multigravida 01/10/2023   s=d; reassuring fwb. reviewed 2nd trim prec in detail.     Endometriosis    Essential hypertension 01/10/2023   GERD (gastroesophageal reflux disease)    Health examination of defined subpopulation 01/10/2023   Hip pain  02/05/2024   Migraines    Need for prophylactic vaccination with combined vaccine 01/10/2023   Need for prophylactic vaccination with measles-mumps-rubella (MMR) vaccine 01/10/2023   Person encountering health services to consult on behalf of another person 01/10/2023   Routine general medical examination at a health care facility 01/10/2023   Routine postpartum follow-up 01/10/2023   Routine postpartum follow-up 01/10/2023   Thyroid  disease 2020   3mm nodule noted and monitored through US    Transient hypertension of pregnancy, antepartum 01/10/2023    Past Surgical History:  Procedure Laterality Date   ABLATION     cardiac   CARDIAC CATHETERIZATION  06/2010   HYSTEROSCOPY      Family History  Problem Relation Age of Onset   Hyperlipidemia Mother    Thyroid  disease Mother    Depression Mother    Bipolar disorder Mother    Arthritis Mother    Miscarriages / Stillbirths Mother    Varicose Veins Mother    Hypertension Mother    Hyperlipidemia Father    Alcohol abuse Father    Depression Father    Bipolar disorder Father    Arthritis Father    Hypertension Father    ADD / ADHD Brother    Hypertension Brother    Learning disabilities Brother    Hypertension Paternal Grandmother    Heart disease Paternal  Grandmother    Stroke Paternal Grandmother    Heart disease Paternal Grandfather    Hypertension Paternal Grandfather    Asthma Daughter    Urticaria Daughter    Anxiety disorder Daughter    Depression Daughter    Anxiety disorder Daughter    Depression Daughter    Urticaria Daughter    Anxiety disorder Daughter    Asthma Daughter    ADD / ADHD Son    Colon cancer Neg Hx    Esophageal cancer Neg Hx    Rectal cancer Neg Hx    Stomach cancer Neg Hx     Social History[1]   Allergies[2]  Review of Systems NEGATIVE UNLESS OTHERWISE INDICATED IN HPI      Objective:     BP 122/84 (BP Location: Left Arm, Patient Position: Sitting, Cuff Size: Normal)   Pulse  94   Temp 98.1 F (36.7 C) (Temporal)   Ht 5' 8.5 (1.74 m)   Wt 202 lb 9.6 oz (91.9 kg)   LMP 01/17/2024 (Exact Date)   SpO2 95%   BMI 30.36 kg/m   Wt Readings from Last 3 Encounters:  02/05/24 202 lb 9.6 oz (91.9 kg)  12/21/23 206 lb 12.8 oz (93.8 kg)  11/05/23 211 lb (95.7 kg)    BP Readings from Last 3 Encounters:  02/05/24 122/84  12/21/23 112/84  11/05/23 123/78     Physical Exam Vitals and nursing note reviewed.  Constitutional:      Appearance: Normal appearance. She is obese. She is not toxic-appearing.  HENT:     Head: Normocephalic and atraumatic.     Right Ear: Tympanic membrane, ear canal and external ear normal.     Left Ear: Tympanic membrane, ear canal and external ear normal.     Nose: Nose normal.     Mouth/Throat:     Mouth: Mucous membranes are moist.  Eyes:     Extraocular Movements: Extraocular movements intact.     Conjunctiva/sclera: Conjunctivae normal.     Pupils: Pupils are equal, round, and reactive to light.  Cardiovascular:     Rate and Rhythm: Normal rate and regular rhythm.     Pulses: Normal pulses.     Heart sounds: Normal heart sounds.  Pulmonary:     Effort: Pulmonary effort is normal.     Breath sounds: Normal breath sounds.  Abdominal:     General: Abdomen is flat. Bowel sounds are normal.     Palpations: Abdomen is soft.  Musculoskeletal:        General: Normal range of motion.     Cervical back: Normal range of motion and neck supple.  Skin:    General: Skin is warm and dry.     Findings: Lesion (small patch of vesicles / papules left hand dorsum) present.  Neurological:     General: No focal deficit present.     Mental Status: She is alert and oriented to person, place, and time.  Psychiatric:        Mood and Affect: Mood normal.        Behavior: Behavior normal.        Thought Content: Thought content normal.        Judgment: Judgment normal.             Hadasah Brugger M Yordy Matton, PA-C     [1]  Social  History Tobacco Use   Smoking status: Former    Current packs/day: 0.00    Average packs/day: 1.5 packs/day for 15.0  years (22.5 ttl pk-yrs)    Types: Cigarettes, E-cigarettes    Start date: 02/07/2000    Quit date: 02/07/2015    Years since quitting: 9.0   Smokeless tobacco: Never  Vaping Use   Vaping status: Former   Substances: Nicotine  Substance Use Topics   Alcohol use: Yes    Alcohol/week: 1.0 standard drink of alcohol    Types: 1 Cans of beer per week    Comment: Occassional social 1-2 drinks   Drug use: Yes    Frequency: 7.0 times per week    Types: Marijuana, Psilocybin  [2] No Known Allergies  "

## 2024-02-08 ENCOUNTER — Encounter: Payer: Self-pay | Admitting: Physician Assistant

## 2024-02-10 ENCOUNTER — Ambulatory Visit: Payer: Self-pay | Admitting: Physician Assistant

## 2024-02-10 ENCOUNTER — Other Ambulatory Visit: Payer: Self-pay | Admitting: Physician Assistant

## 2024-02-10 MED ORDER — VITAMIN D (ERGOCALCIFEROL) 1.25 MG (50000 UNIT) PO CAPS
50000.0000 [IU] | ORAL_CAPSULE | ORAL | 0 refills | Status: AC
  Filled 2024-02-10: qty 12, 84d supply, fill #0

## 2024-02-11 ENCOUNTER — Other Ambulatory Visit (HOSPITAL_COMMUNITY): Payer: Self-pay

## 2024-02-11 ENCOUNTER — Encounter: Payer: Self-pay | Admitting: Emergency Medicine

## 2024-02-11 ENCOUNTER — Telehealth: Admitting: Family Medicine

## 2024-02-11 ENCOUNTER — Encounter: Payer: Self-pay | Admitting: Family Medicine

## 2024-02-11 ENCOUNTER — Ambulatory Visit
Admission: EM | Admit: 2024-02-11 | Discharge: 2024-02-11 | Disposition: A | Discharge status: Home / Self Care | Attending: Family Medicine | Admitting: Family Medicine

## 2024-02-11 DIAGNOSIS — R300 Dysuria: Secondary | ICD-10-CM

## 2024-02-11 DIAGNOSIS — F32A Depression, unspecified: Secondary | ICD-10-CM | POA: Insufficient documentation

## 2024-02-11 DIAGNOSIS — N390 Urinary tract infection, site not specified: Secondary | ICD-10-CM | POA: Insufficient documentation

## 2024-02-11 DIAGNOSIS — N898 Other specified noninflammatory disorders of vagina: Secondary | ICD-10-CM | POA: Insufficient documentation

## 2024-02-11 DIAGNOSIS — Z113 Encounter for screening for infections with a predominantly sexual mode of transmission: Secondary | ICD-10-CM | POA: Insufficient documentation

## 2024-02-11 LAB — POCT URINE DIPSTICK
Bilirubin, UA: NEGATIVE
Glucose, UA: NEGATIVE mg/dL
Ketones, POC UA: NEGATIVE mg/dL
Nitrite, UA: POSITIVE — AB
POC PROTEIN,UA: NEGATIVE
Spec Grav, UA: 1.01
Urobilinogen, UA: 0.2 U/dL
pH, UA: 6.5

## 2024-02-11 LAB — POCT URINE PREGNANCY: Preg Test, Ur: NEGATIVE

## 2024-02-11 MED ORDER — CEFTRIAXONE SODIUM 1 G IJ SOLR
1.0000 g | Freq: Once | INTRAMUSCULAR | Status: AC
  Administered 2024-02-11: 1 g via INTRAMUSCULAR

## 2024-02-11 MED ORDER — FLUCONAZOLE 150 MG PO TABS
150.0000 mg | ORAL_TABLET | ORAL | 0 refills | Status: DC
  Filled 2024-02-11: qty 2, 6d supply, fill #0

## 2024-02-11 MED ORDER — SULFAMETHOXAZOLE-TRIMETHOPRIM 800-160 MG PO TABS
1.0000 | ORAL_TABLET | Freq: Two times a day (BID) | ORAL | 0 refills | Status: AC
  Filled 2024-02-11: qty 14, 7d supply, fill #0

## 2024-02-11 NOTE — Progress Notes (Signed)
" °  Because you are experiencing back and belly pain as well as vomiting, I feel your condition warrants further evaluation and I recommend that you be seen in a face-to-face visit. These symptoms can indicate a more serious infection in the kidney and not just an infection in the bladder. I would recommend that you are seen today in urgent care.    NOTE: There will be NO CHARGE for this E-Visit   If you are having a true medical emergency, please call 911.     For an urgent face to face visit, [facility] has multiple urgent care centers for your convenience.  Click the link below for the full list of locations and hours, walk-in wait times, appointment scheduling options and driving directions:  Urgent Care - [place], [place], [place], [place], [place], [place]  [place]     Your MyChart E-visit questionnaire answers were reviewed by a board certified advanced clinical practitioner to complete your personal care plan based on your specific symptoms.    Thank you for using e-Visits.    "

## 2024-02-11 NOTE — Telephone Encounter (Signed)
 FYI- Patient completed E-Visit and recommendation was UC

## 2024-02-11 NOTE — ED Triage Notes (Signed)
 Pt c/o urinary frequency and urgency onset 5 days ago  St's now she is having low back pain and has vomited x's 1 today.  Pt also st's she thinks she has a yeast infection

## 2024-02-11 NOTE — ED Provider Notes (Addendum)
 " [facility]    CSN: [id number] Arrival date & time: 02/11/24  1026      History   Chief Complaint Chief Complaint  Patient presents with   UTI symptoms    HPI Valerie Kim is a 39 y.o. female presents for dysuria.  Patient reports 4 to 5 days of urinary frequency, burning, urgency.  Had 1 episode of vomiting today and has had some flank pain bilaterally as well.  Denies hematuria, fevers.  Has had some vaginal discharge that is pruritic but does have a history of yeast infection and states this feels similar.  No known STD concern but would like screening.  She does states she has a history of UTIs and kidney infections as well and she feels like it is going towards a kidney infection.  She took Azo OTC for symptoms.  In addition her suicide/self-harm risk screen on intake was positive for thoughts of wanting to hurt herself.  Denies homicidal ideations.  Does not have a plan to hurt herself.  States she has had these thoughts since she was a teenager and has been in and out of therapy for this.  She is not currently seeing anyone.  No other concerns at this time.  HPI  Past Medical History:  Diagnosis Date   Allergies 11/22/2022   Annual physical exam 08/19/2020   Anxiety    Arrhythmia 2003   Symptoms began in high school but were later diagnosed during pregnancy   Common cold 11/22/2022   increase po fluids, rest, tylenol prn, robitussin prn cough     Depression    Diabetic eye exam (HCC) 02/05/2024   Encounter for childcare instruction 02/05/2024   Encounter for medication refill 02/05/2024   Encounter for postpartum care of lactating mother 01/10/2023   Encounter for supervision of normal pregnancy in multigravida 01/10/2023   s=d; reassuring fwb. reviewed 2nd trim prec in detail.     Endometriosis    Essential hypertension 01/10/2023   GERD (gastroesophageal reflux disease)    Health examination of defined subpopulation 01/10/2023   Hip pain 02/05/2024    Migraines    Need for prophylactic vaccination with combined vaccine 01/10/2023   Need for prophylactic vaccination with measles-mumps-rubella (MMR) vaccine 01/10/2023   Person encountering health services to consult on behalf of another person 01/10/2023   Routine general medical examination at a health care facility 01/10/2023   Routine postpartum follow-up 01/10/2023   Routine postpartum follow-up 01/10/2023   Thyroid  disease 2020   3mm nodule noted and monitored through US    Transient hypertension of pregnancy, antepartum 01/10/2023    Patient Active Problem List   Diagnosis Date Noted   Cystic acne 02/05/2024   Low grade squamous intraepithelial cervical dysplasia affecting pregnancy, antepartum 02/05/2024   Need for vaccination 02/05/2024   Skin tag 02/05/2024   Uses oral contraceptives 02/05/2024   Enteritis 01/10/2023   Hypertension 01/10/2023   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   Abdominal pain 01/10/2023   Arthralgia of multiple joints 01/10/2023   Chest pain 01/10/2023   Low grade squamous intraepithelial lesion (LGSIL) on Papanicolaou smear of cervix 01/10/2023   Malaise and fatigue 01/10/2023   Classic migraine with aura 01/10/2023   OCD (obsessive compulsive disorder) 01/10/2023   Rash 01/10/2023   Pain on swallowing 01/10/2023   Palpitations 01/10/2023   Personal history of cardiovascular disorder 01/10/2023   Tobacco use 01/10/2023   Sacroiliitis 01/10/2023   Screening examination for pulmonary tuberculosis 01/10/2023   Screening for malignant neoplasm of  cervix 01/10/2023   Sinusitis 01/10/2023   Sleep disturbances 01/10/2023   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/10/2023   Costochondritis 01/10/2023   Urinary tract infection 01/10/2023   Examination of eyes and vision 01/10/2023   Screening for depression 01/10/2023   Bipolar disorder (HCC) 01/10/2023   Depression 01/10/2023   PTSD (post-traumatic stress disorder) 01/10/2023   Abnormal finding on antenatal screening of  mother 11/22/2022   Acquired scoliosis 11/22/2022   Acute bronchitis 11/22/2022   ADD (attention deficit disorder) 11/22/2022   Adjustment disorder with depressed mood 11/22/2022   Allergic rhinitis 11/22/2022   Allergies 11/22/2022   Cough 11/22/2022   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   Esophageal dysphagia 10/15/2022   Frequent headaches 10/15/2022   Epigastric abdominal pain 10/15/2022   Lumbar spondylosis 05/23/2022   Esophageal reflux 05/23/2022   Ulnar neuropathy of left upper extremity 05/23/2022   GAD (generalized anxiety disorder) 02/27/2022   Chronic post-traumatic stress disorder (PTSD) 02/27/2022   Low back pain 12/05/2021   Supraventricular tachycardia 12/05/2021   Other specified counseling 08/19/2020   Cyst of left ovary 08/19/2020   Anxiety 07/20/2020   Depression, recurrent 07/20/2020   Endometriosis 08/15/2016   SVT (supraventricular tachycardia) 08/15/2000    Past Surgical History:  Procedure Laterality Date   ABLATION     cardiac   CARDIAC CATHETERIZATION  06/2010   HYSTEROSCOPY      OB History   No obstetric history on file.      Home Medications    Prior to Admission medications  Medication Sig Start Date End Date Taking? Authorizing Provider  fluconazole  (DIFLUCAN ) 150 MG tablet Take 1 tablet (150 mg total) by mouth today and may repeat dose in 3 days if symptoms persist 02/11/24 02/17/24 Yes Taijon Vink, Jodi R, NP  sulfamethoxazole -trimethoprim  (BACTRIM  DS) 800-160 MG tablet Take 1 tablet by mouth 2 (two) times daily for 7 days. 02/11/24 02/18/24 Yes [place] Dube, Jodi R, NP  amitriptyline  (ELAVIL ) 25 MG tablet Take 1 tablet (25 mg total) by mouth at bedtime. 12/28/23   Allwardt, Alyssa M, PA-C  azelastine  (ASTELIN ) 0.1 % nasal spray Place 2 sprays into both nostrils 2 (two) times daily as needed. Use in each nostril as directed 12/28/23   Tobie Arleta SQUIBB, MD  clindamycin  (CLEOCIN  T) 1 % lotion Apply 1 Application topically daily. 01/21/24      dexlansoprazole  (DEXILANT ) 60 MG capsule Take 1 capsule (60 mg total) by mouth daily. 12/02/23   Allwardt, Alyssa M, PA-C  mometasone  (NASONEX ) 50 MCG/ACT nasal spray Place 2 sprays into the nose daily. 12/21/23   Tobie Arleta SQUIBB, MD  mupirocin  ointment (BACTROBAN ) 2 % Apply 1 Application topically 3 (three) times daily. Patient taking differently: Apply 1 Application topically 3 (three) times daily. PRN only 11/21/23   Allwardt, Alyssa M, PA-C  triamcinolone  (KENALOG ) 0.025 % cream Apply 1 Application topically 3 (three) times daily. 02/05/24   Allwardt, Alyssa M, PA-C  Vitamin D , Ergocalciferol , (DRISDOL ) 1.25 MG (50000 UNIT) CAPS capsule Take 1 capsule (50,000 Units total) by mouth every 7 (seven) days. 02/10/24   Allwardt, Mardy HERO, PA-C    Family History Family History  Problem Relation Age of Onset   Hyperlipidemia Mother    Thyroid  disease Mother    Depression Mother    Bipolar disorder Mother    Arthritis Mother    Miscarriages / Stillbirths Mother    Varicose Veins Mother    Hypertension Mother    Hyperlipidemia Father    Alcohol abuse Father  Depression Father    Bipolar disorder Father    Arthritis Father    Hypertension Father    ADD / ADHD Brother    Hypertension Brother    Learning disabilities Brother    Hypertension Paternal Grandmother    Heart disease Paternal Grandmother    Stroke Paternal Grandmother    Heart disease Paternal Grandfather    Hypertension Paternal Grandfather    Asthma Daughter    Urticaria Daughter    Anxiety disorder Daughter    Depression Daughter    Anxiety disorder Daughter    Depression Daughter    Urticaria Daughter    Anxiety disorder Daughter    Asthma Daughter    ADD / ADHD Son    Colon cancer Neg Hx    Esophageal cancer Neg Hx    Rectal cancer Neg Hx    Stomach cancer Neg Hx     Social History Social History[1]   Allergies   Patient has no known allergies.   Review of Systems Review of Systems  Gastrointestinal:   Positive for vomiting.  Genitourinary:  Positive for dysuria and flank pain.  Psychiatric/Behavioral:         Depression     Physical Exam Triage Vital Signs ED Triage Vitals  Encounter Vitals Group     BP 02/11/24 1106 130/88     Girls Systolic BP Percentile --      Girls Diastolic BP Percentile --      Boys Systolic BP Percentile --      Boys Diastolic BP Percentile --      Pulse Rate 02/11/24 1106 82     Resp 02/11/24 1106 16     Temp 02/11/24 1106 98.4 F (36.9 C)     Temp Source 02/11/24 1106 Oral     SpO2 02/11/24 1106 97 %     Weight 02/11/24 1113 203 lb (92.1 kg)     Height 02/11/24 1113 5' 10 (1.778 m)     Head Circumference --      Peak Flow --      Pain Score 02/11/24 1113 5     Pain Loc --      Pain Education --      Exclude from Growth Chart --    No data found.  Updated Vital Signs BP 130/88 (BP Location: Right Arm)   Pulse 82   Temp 98.4 F (36.9 C) (Oral)   Resp 16   Ht 5' 10 (1.778 m)   Wt 203 lb (92.1 kg)   LMP 01/17/2024   SpO2 97%   BMI 29.13 kg/m   Visual Acuity Right Eye Distance:   Left Eye Distance:   Bilateral Distance:    Right Eye Near:   Left Eye Near:    Bilateral Near:     Physical Exam Vitals and nursing note reviewed.  Constitutional:      Appearance: Normal appearance.  HENT:     Head: Normocephalic and atraumatic.  Eyes:     Pupils: Pupils are equal, round, and reactive to light.  Cardiovascular:     Rate and Rhythm: Normal rate.  Pulmonary:     Effort: Pulmonary effort is normal.  Abdominal:     Tenderness: There is right CVA tenderness and left CVA tenderness.  Skin:    General: Skin is warm and dry.  Neurological:     General: No focal deficit present.     Mental Status: She is alert and oriented to person, place, and time.  Psychiatric:        Mood and Affect: Mood normal.        Behavior: Behavior normal.      UC Treatments / Results  Labs (all labs ordered are listed, but only abnormal  results are displayed) Labs Reviewed  POCT URINE DIPSTICK - Abnormal; Notable for the following components:      Result Value   Color, UA orange (*)    Blood, UA trace-lysed (*)    Nitrite, UA Positive (*)    Leukocytes, UA Small (1+) (*)    All other components within normal limits  URINE CULTURE  POCT URINE PREGNANCY  CERVICOVAGINAL ANCILLARY ONLY   Comprehensive metabolic panel with GFR Order: [id number]  Status: Final result     Next appt: 02/15/2024 at 09:30 AM in Sports Medicine Townsend Lloyd, MD)     Dx: Annual physical exam   Test Result Released: Yes (seen)     Messages: Seen   1 Result Note     1 Patient Communication     View Follow-Up Encounter     1 HM Topic         Component Ref Range & Units (hover) 6 d ago (02/05/24) 1 yr ago (01/10/23) 1 yr ago (10/11/22) 1 yr ago (05/25/22) 2 yr ago (09/05/21) 3 yr ago (08/19/20)  Sodium 140 140 140 138 143 139 R  Potassium 3.7 3.8 3.8 3.5 4.0 4.1 R  Chloride 106 104 105 102 107 98 R  CO2 28 29 29 29 26 25  R  Comment: Elevated LDH levels may cause falsely increased CO2 results. If LDH is >2000 U/L, a positive bias of 12% is possible.  Glucose, Bld 91 82 76 100 High  74 72 R  BUN 11 11 7 7 7 9  R  Creatinine, Ser 0.79 0.80 0.69 0.74 0.76 0.94 R  Total Bilirubin 0.7 0.5 0.7 0.8 0.6 <0.2 R  Alkaline Phosphatase 68 65 72 95 67 95 R  AST 17 16 R 16 R 20 R 24 R 13 R  ALT 11 11 R 10 R 26 R 26 R 9 R  Total Protein 7.1 6.9 7.2 7.1 6.9 7.1 R  Albumin 4.5 4.4 4.4 4.5 4.4 5.0 High  R  GFR 94.09 93.38 CM 110.19 CM 103.00 CM 100.26 CM   Comment: Calculated using the CKD-EPI Creatinine Equation (2021)  Calcium 9.3 9.4 9.6 9.5 9.7 9.5 R  Resulting Agency [place] [facility] [place] [facility] [place] [facility] [place] [facility] [place] [facility]        Specimen Collected: 02/05/24 11:20 Last Resulted: 02/05/24 14:27   EKG   Radiology No results found.  Procedures Procedures (including critical care time)  Medications Ordered  in UC Medications  cefTRIAXone  (ROCEPHIN ) injection 1 g (1 g Intramuscular Given 02/11/24 1138)    Initial Impression / Assessment and Plan / UC Course  I have reviewed the triage vital signs and the nursing notes.  Pertinent labs & imaging results that were available during my care of the patient were reviewed by me and considered in my medical decision making (see chart for details).     Reviewed exam and symptoms with patient.  Will treat for pyelonephritis given symptoms and exam.  She was given ceftriaxone  IM in clinic.  Monitored for 10 minutes after injection with no reaction noted and tolerated well.  Will send urine culture and start Bactrim  twice daily for 7 days.  Vaginal swab/STD testing as ordered will contact for any positive results.  Start Diflucan  as prescribed.  Did speak with patient at length regarding her suicide screen.  She reports intermittent/passive suicidal ideations without plan.  Has a history of this.  Patient was given information regarding the [facility] and advised to go there today to speak with somebody for further evaluation and treatment and she verbalized understanding.  I did advise her to go to the ER if she develops any thoughts or plan to harm herself or others and she verbalized understanding.  Also to go to the ER for any worsening urinary tract symptoms.  PCP follow-up 2 days for recheck. Final Clinical Impressions(s) / UC Diagnoses   Final diagnoses:  Dysuria  Complicated UTI (urinary tract infection)  Vaginal discharge  Screening examination for STD (sexually transmitted disease)  Depression, unspecified depression type     Discharge Instructions      The clinic will contact you with results of the urine culture done today as well as vaginal swab/STD testing done if positive.  You were given a shot of an antibiotic in the clinic to cover you for possible kidney infection, start Bactrim  twice daily for 7 days.  You  may take Diflucan  as prescribed for yeast infection symptoms.  Lots of rest and fluids.  Please follow-up with her [facility] as soon as possible for further evaluation of your depression.  Please go to the ER if you develop any thoughts and/or plans of wanting to hurt yourself or others.  Follow-up with your PCP in 2 days for recheck.  I hope you feel better soon!    ED Prescriptions     Medication Sig Dispense Auth. Provider   sulfamethoxazole -trimethoprim  (BACTRIM  DS) 800-160 MG tablet Take 1 tablet by mouth 2 (two) times daily for 7 days. 14 tablet Cara Aguino, Jodi R, NP   fluconazole  (DIFLUCAN ) 150 MG tablet Take 1 tablet (150 mg total) by mouth today and may repeat dose in 3 days if symptoms persist 2 tablet Lailana Shira, Jodi R, NP      PDMP not reviewed this encounter.    Loreda Myla SAUNDERS, NP 02/11/24 1139     [1]  Social History Tobacco Use   Smoking status: Former    Current packs/day: 0.00    Average packs/day: 1.5 packs/day for 15.0 years (22.5 ttl pk-yrs)    Types: Cigarettes, E-cigarettes    Start date: 02/07/2000    Quit date: 02/07/2015    Years since quitting: 9.0   Smokeless tobacco: Never  Vaping Use   Vaping status: Former   Substances: Nicotine  Substance Use Topics   Alcohol use: Yes    Alcohol/week: 1.0 standard drink of alcohol    Types: 1 Cans of beer per week    Comment: Occassional social 1-2 drinks   Drug use: Yes    Frequency: 7.0 times per week    Types: Marijuana, Psilocybin     Loreda Myla SAUNDERS, NP 02/11/24 1139  "

## 2024-02-11 NOTE — Telephone Encounter (Signed)
 Called pt and attempted to find an opening but unable to find any at any [place] location. I informed pt of what was advised in previous messages and she verbalized understanding to complete an E-visit.

## 2024-02-11 NOTE — Discharge Instructions (Signed)
 The clinic will contact you with results of the urine culture done today as well as vaginal swab/STD testing done if positive.  You were given a shot of an antibiotic in the clinic to cover you for possible kidney infection, start Bactrim  twice daily for 7 days.  You may take Diflucan  as prescribed for yeast infection symptoms.  Lots of rest and fluids.  Please follow-up with her [facility] as soon as possible for further evaluation of your depression.  Please go to the ER if you develop any thoughts and/or plans of wanting to hurt yourself or others.  Follow-up with your PCP in 2 days for recheck.  I hope you feel better soon!

## 2024-02-12 ENCOUNTER — Ambulatory Visit (HOSPITAL_COMMUNITY): Payer: Self-pay

## 2024-02-12 LAB — CERVICOVAGINAL ANCILLARY ONLY
Bacterial Vaginitis (gardnerella): NEGATIVE
Candida Glabrata: POSITIVE — AB
Candida Vaginitis: NEGATIVE
Chlamydia: NEGATIVE
Comment: NEGATIVE
Comment: NEGATIVE
Comment: NEGATIVE
Comment: NEGATIVE
Comment: NEGATIVE
Comment: NORMAL
Neisseria Gonorrhea: NEGATIVE
Trichomonas: NEGATIVE

## 2024-02-13 LAB — URINE CULTURE: Culture: 20000 — AB

## 2024-02-15 ENCOUNTER — Ambulatory Visit: Admitting: Family Medicine

## 2024-02-15 ENCOUNTER — Other Ambulatory Visit (HOSPITAL_COMMUNITY): Payer: Self-pay

## 2024-02-15 VITALS — BP 116/82 | HR 73 | Ht 70.0 in | Wt 205.0 lb

## 2024-02-15 DIAGNOSIS — M2550 Pain in unspecified joint: Secondary | ICD-10-CM

## 2024-02-15 DIAGNOSIS — Q7962 Hypermobile Ehlers-Danlos syndrome: Secondary | ICD-10-CM | POA: Diagnosis not present

## 2024-02-15 MED ORDER — FLUCONAZOLE 150 MG PO TABS
150.0000 mg | ORAL_TABLET | Freq: Once | ORAL | 0 refills | Status: AC
  Filled 2024-02-15: qty 2, 4d supply, fill #0

## 2024-02-15 NOTE — Progress Notes (Signed)
"       ° °  LILLETTE Ileana Collet, PhD, LAT, ATC acting as a scribe for Artist Lloyd, MD.  Camila Maita is a 39 y.o. female who presents to [facility] today for evaluation of her hypermobility. Ref by PCP.  MS: Chronic joint pain, Chronic wide spread muscle pain, Joint Hypermobility, Joint Instability, Joint Subluxation, Abnormal spinal curvature, and Slipping Ribs / Costochondritis , chronic back pain Skin/Immune reactions: Fragile Skin that tears easily, Soft Velvety Skin, Poor Wound Healing, Easy Bruising / Bleeding, Atrophic Scars, Rashes / Hives, and Medication / Chemical / Food Sensitivities  Neurological: Headache  Migraine, Reduced Proprioception, Clumsiness , Burning, Numbness and/or Tingling in Extremities, Cognitive Dysfunction / Brain Fog, and Sleep Disturbance ANS: Syncope / Pre-Syncope / Dizziness, Fatigue, Raynaud's , and Anxiety / Panic Respiratory: Dyspnea and Chest Tightness CV: Tachycardia and Varicose Veins, palpitations,  GI:  GERD, IBS with Diarrhea or Constipation, Painful ABD Bloating, and Hernia, dysphagia,  Genitourinary: Pelvic Pain / Fullness / Pressure, Incontinence, Recurrent UTI / Cystitis, Painful Intercourse, and Endometriosis,  Hands & Feet: Long Slender Fingers / Toes and Raynaud's Phenomenon   Treatments tried: yoga stretching, IBU, heating pad, laying down  Pertinent review of systems: No fevers or chills  Relevant historical information: SVT history   Exam:  BP 116/82   Pulse 73   Ht 5' 10 (1.778 m)   Wt 205 lb (93 kg)   LMP 01/17/2024   SpO2 99%   BMI 29.41 kg/m  General: Well Developed, well nourished, and in no acute distress.   MSK: Hypermobile evaluation positive.  Beighton score 4/9 with a 1+ modifier for history of excessive hypermobility.  EDS evaluation is positive with a score of 5    Lab and Radiology Results No results found for this or any previous visit (from the past 72 hours). No results  found.     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  Plan for physical therapy.  Addition we discussed typical medical comorbidities including dysautonomia and mast cell activation syndrome.  She has had a pretty extensive cardiovascular workup in the past.  Plan for salt and fluid goals and exercise goals. Resources information provided.  Recheck in 1 month.  PDMP not reviewed this encounter. Orders Placed This Encounter  Procedures   Ambulatory referral to Physical Therapy    Referral Priority:   Routine    Referral Type:   Physical Medicine    Referral Reason:   Specialty Services Required    Requested Specialty:   Physical Therapy    Number of Visits Requested:   1   No orders of the defined types were placed in this encounter.    Discussed warning signs or symptoms. Please see discharge instructions. Patient expresses understanding.   The above documentation has been reviewed and is accurate and complete Artist Lloyd, M.D.   "

## 2024-02-15 NOTE — Patient Instructions (Addendum)
 Thank you for coming in today.   Check back in 1 month  I've referred you to Physical Therapy.  Let us  know if you don't hear from them in one week.   Check out the book Disjointed Navigating the Diagnosis and Management of Hypermobile Ehlers-Danlos Syndrome and Hypermobility Spectrum Disorders.     For POTS symptoms: Consume 3 L water and 8-12 gram of salt per day    Check out these websites for exercises to try if you have POTS (CHOP Protocol, [place] Protocol, and [employer] Protocol) [email or web address].taylor-robbins.[email or web address]/ https://dean.info/    Guide to Prof. Consuelo Patient Self-Care Handouts https://webspace.Http://www.black-smith.org/   Orthostatic Intolerance and Postural Orthostatic Tachycardia Self-Care Checklist https://webspace.Wvlyxc.com   Steps To Managing Your Mast Cell Activation Syndrome [email or web address].Newyearsms.fi.pdf

## 2024-02-21 ENCOUNTER — Other Ambulatory Visit

## 2024-02-25 ENCOUNTER — Other Ambulatory Visit (HOSPITAL_COMMUNITY): Payer: Self-pay

## 2024-02-25 ENCOUNTER — Other Ambulatory Visit: Payer: Self-pay

## 2024-02-26 ENCOUNTER — Other Ambulatory Visit (HOSPITAL_COMMUNITY): Payer: Self-pay

## 2024-02-27 ENCOUNTER — Other Ambulatory Visit (HOSPITAL_COMMUNITY): Payer: Self-pay

## 2024-02-28 ENCOUNTER — Ambulatory Visit
Admission: RE | Admit: 2024-02-28 | Discharge: 2024-02-28 | Disposition: A | Discharge status: Home / Self Care | Source: Ambulatory Visit | Attending: Physician Assistant | Admitting: Physician Assistant

## 2024-02-28 DIAGNOSIS — N939 Abnormal uterine and vaginal bleeding, unspecified: Secondary | ICD-10-CM

## 2024-03-04 NOTE — Progress Notes (Incomplete)
 " OUTPATIENT PHYSICAL THERAPY THORACOLUMBAR EVALUATION   Patient Name: Valerie Kim MRN: [id number] DOB:October 15, 1985, 39 y.o., female Today's Date: 03/04/2024  END OF SESSION:   Past Medical History:  Diagnosis Date   Allergies 11/22/2022   Annual physical exam 08/19/2020   Anxiety    Arrhythmia 2003   Symptoms began in high school but were later diagnosed during pregnancy   Common cold 11/22/2022   increase po fluids, rest, tylenol prn, robitussin prn cough     Depression    Diabetic eye exam (HCC) 02/05/2024   Encounter for childcare instruction 02/05/2024   Encounter for medication refill 02/05/2024   Encounter for postpartum care of lactating mother 01/10/2023   Encounter for supervision of normal pregnancy in multigravida 01/10/2023   s=d; reassuring fwb. reviewed 2nd trim prec in detail.     Endometriosis    Essential hypertension 01/10/2023   GERD (gastroesophageal reflux disease)    Health examination of defined subpopulation 01/10/2023   Hip pain 02/05/2024   Migraines    Need for prophylactic vaccination with combined vaccine 01/10/2023   Need for prophylactic vaccination with measles-mumps-rubella (MMR) vaccine 01/10/2023   Person encountering health services to consult on behalf of another person 01/10/2023   Routine general medical examination at a health care facility 01/10/2023   Routine postpartum follow-up 01/10/2023   Routine postpartum follow-up 01/10/2023   Thyroid  disease 2020   3mm nodule noted and monitored through US    Transient hypertension of pregnancy, antepartum 01/10/2023   Past Surgical History:  Procedure Laterality Date   ABLATION     cardiac   CARDIAC CATHETERIZATION  06/2010   HYSTEROSCOPY     Patient Active Problem List   Diagnosis Date Noted   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   Cystic acne 02/05/2024   Low grade squamous intraepithelial cervical dysplasia affecting pregnancy, antepartum 02/05/2024   Need for  vaccination 02/05/2024   Skin tag 02/05/2024   Uses oral contraceptives 02/05/2024   Enteritis 01/10/2023   Hypertension 01/10/2023   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   Abdominal pain 01/10/2023   Arthralgia of multiple joints 01/10/2023   Chest pain 01/10/2023   Low grade squamous intraepithelial lesion (LGSIL) on Papanicolaou smear of cervix 01/10/2023   Malaise and fatigue 01/10/2023   Classic migraine with aura 01/10/2023   OCD (obsessive compulsive disorder) 01/10/2023   Rash 01/10/2023   Pain on swallowing 01/10/2023   Palpitations 01/10/2023   Personal history of cardiovascular disorder 01/10/2023   Tobacco use 01/10/2023   Sacroiliitis 01/10/2023   Screening examination for pulmonary tuberculosis 01/10/2023   Screening for malignant neoplasm of cervix 01/10/2023   Sinusitis 01/10/2023   Sleep disturbances 01/10/2023   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/10/2023   Costochondritis 01/10/2023   Urinary tract infection 01/10/2023   Examination of eyes and vision 01/10/2023   Screening for depression 01/10/2023   Bipolar disorder (HCC) 01/10/2023   Depression 01/10/2023   PTSD (post-traumatic stress disorder) 01/10/2023   Abnormal finding on antenatal screening of mother 11/22/2022   Acquired scoliosis 11/22/2022   Acute bronchitis 11/22/2022   ADD (attention deficit disorder) 11/22/2022   Adjustment disorder with depressed mood 11/22/2022   Allergic rhinitis 11/22/2022   Allergies 11/22/2022   Cough 11/22/2022   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   Esophageal dysphagia 10/15/2022   Frequent headaches 10/15/2022   Epigastric abdominal pain 10/15/2022   Lumbar spondylosis 05/23/2022   Esophageal reflux 05/23/2022   Ulnar neuropathy of left upper extremity 05/23/2022   GAD (generalized anxiety disorder)  02/27/2022   Chronic post-traumatic stress disorder (PTSD) 02/27/2022   Low back pain 12/05/2021   Supraventricular tachycardia 12/05/2021   Other specified counseling  08/19/2020   Cyst of left ovary 08/19/2020   Anxiety 07/20/2020   Depression, recurrent 07/20/2020   Endometriosis 08/15/2016   SVT (supraventricular tachycardia) 08/15/2000    PCP: Allwardt, Mardy HERO, PA-C   REFERRING PROVIDER: Joane Artist RAMAN, MD   REFERRING DIAG:  M25.50 (ICD-10-CM) - Polyarthralgia  M25.50 (ICD-10-CM) - Hypermobility arthralgia    Rationale for Evaluation and Treatment: Rehabilitation  THERAPY DIAG:  No diagnosis found.  ONSET DATE: ***  SUBJECTIVE:                                                                                                                                                                                           SUBJECTIVE STATEMENT: ***  PERTINENT HISTORY:  ***  PAIN:  Are you having pain? Yes: NPRS scale: *** Pain location: *** Pain description: *** Aggravating factors: *** Relieving factors: ***  PRECAUTIONS: {Therapy precautions:24002}  RED FLAGS: {PT Red Flags:29287}   WEIGHT BEARING RESTRICTIONS: {Yes ***/No:24003}  FALLS:  Has patient fallen in last 6 months? {fallsyesno:27318}  LIVING ENVIRONMENT: Lives with: {OPRC lives with:25569::lives with their family} Lives in: {Lives in:25570} Stairs: {opstairs:27293} Has following equipment at home: {Assistive devices:23999}  OCCUPATION: ***  PLOF: {PLOF:24004}  PATIENT GOALS: ***  NEXT MD VISIT: ***  OBJECTIVE:  Note: Objective measures were completed at Evaluation unless otherwise noted.  DIAGNOSTIC FINDINGS:  ***  PATIENT SURVEYS:  {rehab surveys:24030}  COGNITION: Overall cognitive status: {cognition:24006}     SENSATION: {sensation:27233}  MUSCLE LENGTH: Hamstrings: Right *** deg; Left *** deg Debby test: Right *** deg; Left *** deg  POSTURE: {posture:25561}  PALPATION: ***  LUMBAR ROM:   AROM eval  Flexion   Extension   Right lateral flexion   Left lateral flexion   Right rotation   Left rotation    (Blank rows = not  tested)  LOWER EXTREMITY ROM:     {AROM/PROM:27142}  Right eval Left eval  Hip flexion    Hip extension    Hip abduction    Hip adduction    Hip internal rotation    Hip external rotation    Knee flexion    Knee extension    Ankle dorsiflexion    Ankle plantarflexion    Ankle inversion    Ankle eversion     (Blank rows = not tested)  LOWER EXTREMITY MMT:    MMT Right eval Left eval  Hip flexion    Hip extension    Hip abduction  Hip adduction    Hip internal rotation    Hip external rotation    Knee flexion    Knee extension    Ankle dorsiflexion    Ankle plantarflexion    Ankle inversion    Ankle eversion     (Blank rows = not tested)  LUMBAR SPECIAL TESTS:  {lumbar special test:25242}  FUNCTIONAL TESTS:  {Functional tests:24029}  GAIT: Distance walked: *** Assistive device utilized: {Assistive devices:23999} Level of assistance: {Levels of assistance:24026} Comments: ***  TREATMENT DATE: ***                                                                                                                                 PATIENT EDUCATION:  Education details: *** Person educated: {Person educated:25204} Education method: {Education Method:25205} Education comprehension: {Education Comprehension:25206}  HOME EXERCISE PROGRAM: ***  ASSESSMENT:  CLINICAL IMPRESSION: Patient is a 39 y.o. female who was seen today for physical therapy evaluation and treatment for  M25.50 (ICD-10-CM) - Polyarthralgia  M25.50 (ICD-10-CM) - Hypermobility arthralgia   Evaluate and Treat for hypermobility/multiple myalgias  1-2 times per week for 4-6 weeks.    Decrease pain, increase strength, flexibility, function, and range of motion.  Modalities may include, traction, ionto, phono, stim, and dry needling prn.    OBJECTIVE IMPAIRMENTS: {opptimpairments:25111}.   ACTIVITY LIMITATIONS: {activitylimitations:27494}  PARTICIPATION LIMITATIONS:  {participationrestrictions:25113}  PERSONAL FACTORS: {Personal factors:25162} are also affecting patient's functional outcome.   REHAB POTENTIAL: {rehabpotential:25112}  CLINICAL DECISION MAKING: {clinical decision making:25114}  EVALUATION COMPLEXITY: {Evaluation complexity:25115}   GOALS:  SHORT TERM GOALS: Target date: ***  Pt will be Ind in an initial HEP  Baseline: started Goal status: INITIAL  2.  *** Baseline:  Goal status: INITIAL  3.  *** Baseline:  Goal status: INITIAL  4.  *** Baseline:  Goal status: INITIAL  5.  *** Baseline:  Goal status: INITIAL  6.  *** Baseline:  Goal status: INITIAL  LONG TERM GOALS: Target date: ***  Pt will be Ind in a final HEP to maintain achieved LOF Baseline: started Goal status: INITIAL  2.  *** Baseline:  Goal status: INITIAL  3.  *** Baseline:  Goal status: INITIAL  4.  *** Baseline:  Goal status: INITIAL  5.  *** Baseline:  Goal status: INITIAL  6.  *** Baseline:  Goal status: INITIAL  PLAN:  PT FREQUENCY: {rehab frequency:25116}  PT DURATION: {rehab duration:25117}  PLANNED INTERVENTIONS: {rehab planned interventions:25118::97110-Therapeutic exercises,97530- Therapeutic [phone]- Neuromuscular re-education,97535- Self Rjmz,02859- Manual therapy,Patient/Family education}.  PLAN FOR NEXT SESSION: ***   Dasie Daft, PT 03/04/2024, 3:21 PM  "

## 2024-03-05 ENCOUNTER — Ambulatory Visit

## 2024-03-07 ENCOUNTER — Ambulatory Visit: Attending: Family Medicine

## 2024-03-07 ENCOUNTER — Other Ambulatory Visit: Payer: Self-pay

## 2024-03-07 DIAGNOSIS — M25551 Pain in right hip: Secondary | ICD-10-CM | POA: Insufficient documentation

## 2024-03-07 DIAGNOSIS — M25552 Pain in left hip: Secondary | ICD-10-CM | POA: Diagnosis present

## 2024-03-07 DIAGNOSIS — M2550 Pain in unspecified joint: Secondary | ICD-10-CM | POA: Insufficient documentation

## 2024-03-07 DIAGNOSIS — M357 Hypermobility syndrome: Secondary | ICD-10-CM | POA: Diagnosis present

## 2024-03-07 DIAGNOSIS — Q7962 Hypermobile Ehlers-Danlos syndrome: Secondary | ICD-10-CM | POA: Insufficient documentation

## 2024-03-07 DIAGNOSIS — R262 Difficulty in walking, not elsewhere classified: Secondary | ICD-10-CM | POA: Diagnosis present

## 2024-03-07 NOTE — Therapy (Signed)
 " OUTPATIENT PHYSICAL THERAPY  EVALUATION   Patient Name: Valerie Kim MRN: [id number] DOB:1985/04/05, 39 y.o., female Today's Date: 03/07/2024  END OF SESSION:   Past Medical History:  Diagnosis Date   Allergies 11/22/2022   Annual physical exam 08/19/2020   Anxiety    Arrhythmia 2003   Symptoms began in high school but were later diagnosed during pregnancy   Common cold 11/22/2022   increase po fluids, rest, tylenol prn, robitussin prn cough     Depression    Diabetic eye exam (HCC) 02/05/2024   Encounter for childcare instruction 02/05/2024   Encounter for medication refill 02/05/2024   Encounter for postpartum care of lactating mother 01/10/2023   Encounter for supervision of normal pregnancy in multigravida 01/10/2023   s=d; reassuring fwb. reviewed 2nd trim prec in detail.     Endometriosis    Essential hypertension 01/10/2023   GERD (gastroesophageal reflux disease)    Health examination of defined subpopulation 01/10/2023   Hip pain 02/05/2024   Migraines    Need for prophylactic vaccination with combined vaccine 01/10/2023   Need for prophylactic vaccination with measles-mumps-rubella (MMR) vaccine 01/10/2023   Person encountering health services to consult on behalf of another person 01/10/2023   Routine general medical examination at a health care facility 01/10/2023   Routine postpartum follow-up 01/10/2023   Routine postpartum follow-up 01/10/2023   Thyroid  disease 2020   3mm nodule noted and monitored through US    Transient hypertension of pregnancy, antepartum 01/10/2023   Past Surgical History:  Procedure Laterality Date   ABLATION     cardiac   CARDIAC CATHETERIZATION  06/2010   HYSTEROSCOPY     Patient Active Problem List   Diagnosis Date Noted   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   Cystic acne 02/05/2024   Low grade squamous intraepithelial cervical dysplasia affecting pregnancy, antepartum 02/05/2024   Need for vaccination  02/05/2024   Skin tag 02/05/2024   Uses oral contraceptives 02/05/2024   Enteritis 01/10/2023   Hypertension 01/10/2023   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   Abdominal pain 01/10/2023   Arthralgia of multiple joints 01/10/2023   Chest pain 01/10/2023   Low grade squamous intraepithelial lesion (LGSIL) on Papanicolaou smear of cervix 01/10/2023   Malaise and fatigue 01/10/2023   Classic migraine with aura 01/10/2023   OCD (obsessive compulsive disorder) 01/10/2023   Rash 01/10/2023   Pain on swallowing 01/10/2023   Palpitations 01/10/2023   Personal history of cardiovascular disorder 01/10/2023   Tobacco use 01/10/2023   Sacroiliitis 01/10/2023   Screening examination for pulmonary tuberculosis 01/10/2023   Screening for malignant neoplasm of cervix 01/10/2023   Sinusitis 01/10/2023   Sleep disturbances 01/10/2023   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/10/2023   Costochondritis 01/10/2023   Urinary tract infection 01/10/2023   Examination of eyes and vision 01/10/2023   Screening for depression 01/10/2023   Bipolar disorder (HCC) 01/10/2023   Depression 01/10/2023   PTSD (post-traumatic stress disorder) 01/10/2023   Abnormal finding on antenatal screening of mother 11/22/2022   Acquired scoliosis 11/22/2022   Acute bronchitis 11/22/2022   ADD (attention deficit disorder) 11/22/2022   Adjustment disorder with depressed mood 11/22/2022   Allergic rhinitis 11/22/2022   Allergies 11/22/2022   Cough 11/22/2022   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   Esophageal dysphagia 10/15/2022   Frequent headaches 10/15/2022   Epigastric abdominal pain 10/15/2022   Lumbar spondylosis 05/23/2022   Esophageal reflux 05/23/2022   Ulnar neuropathy of left upper extremity 05/23/2022   GAD (generalized anxiety disorder)  02/27/2022   Chronic post-traumatic stress disorder (PTSD) 02/27/2022   Low back pain 12/05/2021   Supraventricular tachycardia 12/05/2021   Other specified counseling 08/19/2020    Cyst of left ovary 08/19/2020   Anxiety 07/20/2020   Depression, recurrent 07/20/2020   Endometriosis 08/15/2016   SVT (supraventricular tachycardia) 08/15/2000    PCP: Allwardt, Mardy HERO, PA-C   REFERRING PROVIDER: Joane Artist RAMAN, MD   REFERRING DIAG:  M25.50 (ICD-10-CM) - Polyarthralgia  M25.50 (ICD-10-CM) - Hypermobility arthralgia   THERAPY DIAG:  No diagnosis found.  Rationale for Evaluation and Treatment: Rehabilitation  ONSET DATE: ***  SUBJECTIVE:                                                                                                                                                                                                         SUBJECTIVE STATEMENT: Patient reports that she has had hypermobility symptoms for most of her life that has worsened over the past two years. She has BIL SIJ point, L hip pain and grinding sensation, R hip is beginning to have grinding, BIL knee pain rice krispies, BIL shoulders pain, BIL ankle instability and rolling, BIL wrists pain that is limiting her from grasping and carrying things. She has difficulty with crocheting, knitting, typing. Stair all difficult, reliance on railing.    PERTINENT HISTORY:  ***  PAIN:  Are you having pain? Yes, see eval subjective  BIL hip 7/10 current  PRECAUTIONS: None  RED FLAGS: None    WEIGHT BEARING RESTRICTIONS: No  FALLS:  Has patient fallen in last 6 months? No  LIVING ENVIRONMENT: Lives with: lives with their family and lives with their spouse Lives in: House/apartment Stairs: No Has following equipment at home: None  OCCUPATION: bookkeeper   PLOF: Independent  PATIENT GOALS: stabilization and reduce pain   NEXT MD VISIT: ***  OBJECTIVE:  Note: Objective measures were completed at Evaluation unless otherwise noted.  DIAGNOSTIC FINDINGS:  ***  PATIENT SURVEYS:   PSFS: THE PATIENT SPECIFIC FUNCTIONAL SCALE  Place score of 0-10 (0 = unable to perform activity  and 10 = able to perform activity at the same level as before injury or problem)  Activity Date: 03/07/2024          2.     3.     4.      5.      Total Score ***      Total Score = Sum of activity scores/number of activities  Minimally Detectable Change: 3 points (for single activity); 2 points (for average score)  [facility] (nd). The Patient Specific Functional Scale . Retrieved from [email or web address]   COGNITION: Overall cognitive status: Within functional limits for tasks assessed  SENSATION: {sensation:27233}  POSTURE: {posture:25561}  PALPATION: ***   CERVICAL ROM:   Active ROM A/PROM (deg) eval  Flexion Pain posterior neck and back of R arm   Extension   Right lateral flexion   Left lateral flexion   Right rotation   Left rotation    (Blank rows = not tested)   UPPER EXTREMITY MMT:  MMT Right eval Left eval  Shoulder flexion 4 4  Shoulder extension    Shoulder abduction 4 4  Shoulder adduction    Shoulder extension    Shoulder internal rotation    Shoulder external rotation    Middle trapezius    Lower trapezius    Elbow flexion 4 4  Elbow extension 4 4  Wrist flexion 4+, p! 4+, p!  Wrist extension 4+, p! 4+, p!  Wrist ulnar deviation 4 4  Wrist radial deviation 4 4  Wrist pronation    Wrist supination    Grip strength 73.3# 71.5#   (Blank rows = not tested)  LUMBAR ROM:   Active  A/PROM  eval  Flexion Pain thoracic spine, BIL HS   Extension SIJ/BIL lower back  Right lateral flexion CL lumbar/pelvic  Left lateral flexion CL lumbar/pelvic   Right rotation 50% full ROM, feel stiffness  Left rotation 50% full ROM, feel stiffness   (Blank rows = not tested)    LOWER EXTREMITY MMT:    MMT Right eval Left eval  Hip flexion 4+ 4+  Hip extension    Hip abduction 4 4  Hip adduction 4- 4-  Hip internal rotation    Hip external rotation    Knee flexion 4+ 4+   Knee extension 4+ 4+  Ankle dorsiflexion    Ankle plantarflexion    Ankle inversion    Ankle eversion     (Blank rows = not tested)   FUNCTIONAL TESTS:  deferred   CORE STRENGTH:  DLLT: unable Sit up test: 2/5   TODAY'S TREATMENT:                                                                                                                               [facility] Adult PT Treatment:                                                DATE: 03/07/2024   Initial evaluation: see patient education and home exercise program as noted below     PATIENT EDUCATION:  Education details: reviewed initial home exercise program; discussion of POC, prognosis and goals for skilled PT   Person educated: Patient Education method: Explanation, Demonstration, and Handouts Education comprehension: verbalized understanding, returned demonstration, and needs  further education  HOME EXERCISE PROGRAM: ***  ASSESSMENT:  CLINICAL IMPRESSION: Ambrea is a 39 y.o. female who was seen today for physical therapy evaluation and treatment for ***. She is demonstrating ***. She has related pain and difficulty with ***. She requires skilled PT services at this time to address relevant deficits and improve overall function.     OBJECTIVE IMPAIRMENTS: {opptimpairments:25111}.   ACTIVITY LIMITATIONS: {activitylimitations:27494}  PARTICIPATION LIMITATIONS: {participationrestrictions:25113}  PERSONAL FACTORS: {Personal factors:25162} are also affecting patient's functional outcome.   REHAB POTENTIAL: {rehabpotential:25112}  CLINICAL DECISION MAKING: {clinical decision making:25114}  EVALUATION COMPLEXITY: {Evaluation complexity:25115}   GOALS: Goals reviewed with patient? YES   SHORT TERM GOALS: Target date: 04/04/2024    Patient will be independent with initial home program at least 3 days/week.  Baseline: provided at eval Goal Status: INITIAL   2.  Patient will demonstrate improved postural  awareness for at least 15 minutes while seated without need for cueing from PT.  Baseline: see objective measures Goal Status: INITIAL   3.  *** Baseline:  Goal status: INITIAL   LONG TERM GOALS: Target date: 05/02/2024    Patient will report improved overall functional ability with *** score of ***.  Baseline:  Goal Status: INITIAL    2.  *** Baseline:  Goal status: INITIAL  3.  *** Baseline:  Goal status: INITIAL  4.  *** Baseline:  Goal status: INITIAL     PLAN:  PT FREQUENCY: 1-2x/week  PT DURATION: 8 weeks  PLANNED INTERVENTIONS: {rehab planned interventions:25118::97110-Therapeutic exercises,97530- Therapeutic [phone]- Neuromuscular re-education,97535- Self Rjmz,02859- Manual therapy} Aquatic therapy  PLAN FOR NEXT SESSION: PIERRETTE Marko Molt, PT, DPT  03/07/2024 9:05 AM        "

## 2024-03-12 NOTE — Progress Notes (Unsigned)
"       ° °  LILLETTE Ileana Collet, PhD, LAT, ATC acting as a scribe for Valerie Lloyd, MD.  Valerie Kim is a 39 y.o. female who presents to [facility] today for 68-month f/u hEDS. Pt was last seen by Dr. Lloyd on 02/15/24 and was advised on fluid/salt intake and exercise goals. Provided informational resources and referred to PT, completing 1 visits.  Today, pt reports ***  Pertinent review of systems: ***  Relevant historical information: ***   Exam:  There were no vitals taken for this visit. General: Well Developed, well nourished, and in no acute distress.   MSK: ***    Lab and Radiology Results No results found for this or any previous visit (from the past 72 hours). No results found.     Assessment and Plan: 39 y.o. female with ***   PDMP not reviewed this encounter. No orders of the defined types were placed in this encounter.  No orders of the defined types were placed in this encounter.    Discussed warning signs or symptoms. Please see discharge instructions. Patient expresses understanding.   ***  "

## 2024-03-17 ENCOUNTER — Ambulatory Visit: Admitting: Family Medicine

## 2024-03-19 ENCOUNTER — Ambulatory Visit

## 2024-03-24 ENCOUNTER — Ambulatory Visit

## 2024-03-26 ENCOUNTER — Ambulatory Visit: Admitting: Physical Therapy

## 2024-03-31 ENCOUNTER — Ambulatory Visit

## 2024-04-04 ENCOUNTER — Ambulatory Visit

## 2024-04-09 ENCOUNTER — Ambulatory Visit: Admitting: Physical Therapy

## 2024-08-04 ENCOUNTER — Ambulatory Visit: Admitting: Physician Assistant

## 2025-02-10 ENCOUNTER — Encounter: Admitting: Physician Assistant
